# Patient Record
Sex: Female | Born: 1935 | Race: Black or African American | Hispanic: No | Smoking: Never smoker
Health system: Southern US, Community
[De-identification: ages and names within clinical notes are randomized; demographics above are authoritative.]

## PROBLEM LIST (undated history)

## (undated) DIAGNOSIS — G9340 Encephalopathy, unspecified: Secondary | ICD-10-CM

## (undated) DIAGNOSIS — M1990 Unspecified osteoarthritis, unspecified site: Secondary | ICD-10-CM

## (undated) DIAGNOSIS — I4510 Unspecified right bundle-branch block: Secondary | ICD-10-CM

## (undated) DIAGNOSIS — I10 Essential (primary) hypertension: Secondary | ICD-10-CM

## (undated) DIAGNOSIS — E46 Unspecified protein-calorie malnutrition: Secondary | ICD-10-CM

## (undated) DIAGNOSIS — G459 Transient cerebral ischemic attack, unspecified: Secondary | ICD-10-CM

## (undated) DIAGNOSIS — I639 Cerebral infarction, unspecified: Secondary | ICD-10-CM

## (undated) HISTORY — PX: ABDOMINAL HYSTERECTOMY: SHX81

---

## 1998-03-04 ENCOUNTER — Ambulatory Visit (HOSPITAL_COMMUNITY): Admission: RE | Admit: 1998-03-04 | Discharge: 1998-03-04 | Payer: Self-pay | Admitting: Family Medicine

## 2005-04-18 ENCOUNTER — Emergency Department (HOSPITAL_COMMUNITY): Admission: EM | Admit: 2005-04-18 | Discharge: 2005-04-18 | Payer: Self-pay | Admitting: Emergency Medicine

## 2006-07-20 ENCOUNTER — Encounter (HOSPITAL_COMMUNITY): Admission: RE | Admit: 2006-07-20 | Discharge: 2006-07-20 | Payer: Self-pay | Admitting: Cardiology

## 2009-01-26 ENCOUNTER — Emergency Department (HOSPITAL_COMMUNITY): Admission: EM | Admit: 2009-01-26 | Discharge: 2009-01-26 | Payer: Self-pay | Admitting: Emergency Medicine

## 2009-05-30 ENCOUNTER — Emergency Department (HOSPITAL_BASED_OUTPATIENT_CLINIC_OR_DEPARTMENT_OTHER): Admission: EM | Admit: 2009-05-30 | Discharge: 2009-05-30 | Payer: Self-pay | Admitting: Emergency Medicine

## 2009-05-30 ENCOUNTER — Ambulatory Visit: Payer: Self-pay | Admitting: Diagnostic Radiology

## 2010-09-19 ENCOUNTER — Emergency Department (HOSPITAL_COMMUNITY)
Admission: EM | Admit: 2010-09-19 | Discharge: 2010-09-19 | Payer: Self-pay | Source: Home / Self Care | Admitting: Emergency Medicine

## 2010-10-24 ENCOUNTER — Encounter: Payer: Self-pay | Admitting: Cardiology

## 2010-12-14 LAB — URINALYSIS, ROUTINE W REFLEX MICROSCOPIC
Hgb urine dipstick: NEGATIVE
Nitrite: NEGATIVE
Protein, ur: 30 mg/dL — AB
Urobilinogen, UA: 2 mg/dL — ABNORMAL HIGH (ref 0.0–1.0)

## 2010-12-14 LAB — BASIC METABOLIC PANEL
BUN: 20 mg/dL (ref 6–23)
CO2: 29 mEq/L (ref 19–32)
Calcium: 9.7 mg/dL (ref 8.4–10.5)
Creatinine, Ser: 0.8 mg/dL (ref 0.4–1.2)
GFR calc non Af Amer: >60 mL/min (ref >60)
Glucose, Bld: 97 mg/dL (ref 70–99)

## 2010-12-14 LAB — DIFFERENTIAL
Basophils Absolute: 0 10*3/uL (ref 0.0–0.1)
Basophils Relative: 0 % (ref 0–1)
Eosinophils Absolute: 0.1 10*3/uL (ref 0.0–0.7)
Monocytes Relative: 7 % (ref 3–12)
Neutrophils Relative %: 79 % — ABNORMAL HIGH (ref 43–77)

## 2010-12-14 LAB — CBC
Hemoglobin: 11.6 g/dL — ABNORMAL LOW (ref 12.0–15.0)
MCH: 28.4 pg (ref 26.0–34.0)
MCHC: 32.5 g/dL (ref 30.0–36.0)
Platelets: 170 10*3/uL (ref 150–400)
RDW: 14.2 % (ref 11.5–15.5)

## 2010-12-14 LAB — URINE MICROSCOPIC-ADD ON

## 2010-12-24 ENCOUNTER — Emergency Department (HOSPITAL_COMMUNITY)
Admission: EM | Admit: 2010-12-24 | Discharge: 2010-12-24 | Disposition: A | Discharge status: Home / Self Care | Payer: Medicare Other | Attending: Family Medicine | Admitting: Family Medicine

## 2010-12-24 DIAGNOSIS — R319 Hematuria, unspecified: Secondary | ICD-10-CM | POA: Insufficient documentation

## 2010-12-24 DIAGNOSIS — I10 Essential (primary) hypertension: Secondary | ICD-10-CM | POA: Insufficient documentation

## 2010-12-24 DIAGNOSIS — N390 Urinary tract infection, site not specified: Secondary | ICD-10-CM | POA: Insufficient documentation

## 2010-12-24 DIAGNOSIS — R300 Dysuria: Secondary | ICD-10-CM | POA: Insufficient documentation

## 2010-12-24 DIAGNOSIS — M1990 Unspecified osteoarthritis, unspecified site: Secondary | ICD-10-CM | POA: Insufficient documentation

## 2010-12-24 LAB — URINE MICROSCOPIC-ADD ON

## 2010-12-24 LAB — URINALYSIS, ROUTINE W REFLEX MICROSCOPIC
Nitrite: POSITIVE — AB
Specific Gravity, Urine: 1.022 (ref 1.005–1.030)
pH: 8 (ref 5.0–8.0)

## 2010-12-27 LAB — URINE CULTURE

## 2011-01-13 LAB — URINALYSIS, ROUTINE W REFLEX MICROSCOPIC
Bilirubin Urine: NEGATIVE
Glucose, UA: NEGATIVE mg/dL
Ketones, ur: NEGATIVE mg/dL
Protein, ur: NEGATIVE mg/dL

## 2011-01-13 LAB — CBC
Hemoglobin: 12.1 g/dL (ref 12.0–15.0)
RBC: 4.07 MIL/uL (ref 3.87–5.11)

## 2011-01-13 LAB — URINE CULTURE: Colony Count: 3000

## 2011-01-13 LAB — COMPREHENSIVE METABOLIC PANEL
ALT: 10 U/L (ref 0–35)
Alkaline Phosphatase: 60 U/L (ref 39–117)
CO2: 28 mEq/L (ref 19–32)
GFR calc non Af Amer: >60 mL/min (ref >60)
Glucose, Bld: 92 mg/dL (ref 70–99)
Potassium: 3.3 mEq/L — ABNORMAL LOW (ref 3.5–5.1)
Sodium: 139 mEq/L (ref 135–145)
Total Protein: 6.2 g/dL (ref 6.0–8.3)

## 2011-01-13 LAB — DIFFERENTIAL
Basophils Relative: 1 % (ref 0–1)
Eosinophils Absolute: 0.1 10*3/uL (ref 0.0–0.7)
Monocytes Relative: 12 % (ref 3–12)
Neutrophils Relative %: 49 % (ref 43–77)

## 2011-10-27 ENCOUNTER — Encounter (HOSPITAL_BASED_OUTPATIENT_CLINIC_OR_DEPARTMENT_OTHER): Payer: Self-pay

## 2011-10-27 ENCOUNTER — Emergency Department (INDEPENDENT_AMBULATORY_CARE_PROVIDER_SITE_OTHER): Payer: Medicare Other

## 2011-10-27 ENCOUNTER — Observation Stay (HOSPITAL_BASED_OUTPATIENT_CLINIC_OR_DEPARTMENT_OTHER)
Admission: EM | Admit: 2011-10-27 | Discharge: 2011-10-29 | Disposition: A | Discharge status: Home / Self Care | Payer: Medicare Other | Source: Ambulatory Visit | Attending: Internal Medicine | Admitting: Internal Medicine

## 2011-10-27 DIAGNOSIS — M25559 Pain in unspecified hip: Secondary | ICD-10-CM

## 2011-10-27 DIAGNOSIS — R6251 Failure to thrive (child): Secondary | ICD-10-CM

## 2011-10-27 DIAGNOSIS — M1990 Unspecified osteoarthritis, unspecified site: Secondary | ICD-10-CM

## 2011-10-27 DIAGNOSIS — Z9181 History of falling: Secondary | ICD-10-CM | POA: Insufficient documentation

## 2011-10-27 DIAGNOSIS — R6889 Other general symptoms and signs: Secondary | ICD-10-CM | POA: Insufficient documentation

## 2011-10-27 DIAGNOSIS — D649 Anemia, unspecified: Secondary | ICD-10-CM

## 2011-10-27 DIAGNOSIS — M129 Arthropathy, unspecified: Secondary | ICD-10-CM | POA: Insufficient documentation

## 2011-10-27 DIAGNOSIS — M6281 Muscle weakness (generalized): Secondary | ICD-10-CM | POA: Insufficient documentation

## 2011-10-27 DIAGNOSIS — M25551 Pain in right hip: Secondary | ICD-10-CM

## 2011-10-27 DIAGNOSIS — M25569 Pain in unspecified knee: Secondary | ICD-10-CM | POA: Insufficient documentation

## 2011-10-27 DIAGNOSIS — Z01818 Encounter for other preprocedural examination: Secondary | ICD-10-CM

## 2011-10-27 DIAGNOSIS — R531 Weakness: Secondary | ICD-10-CM

## 2011-10-27 DIAGNOSIS — W19XXXA Unspecified fall, initial encounter: Secondary | ICD-10-CM

## 2011-10-27 DIAGNOSIS — R627 Adult failure to thrive: Principal | ICD-10-CM | POA: Insufficient documentation

## 2011-10-27 HISTORY — DX: Unspecified osteoarthritis, unspecified site: M19.90

## 2011-10-27 LAB — CBC
MCH: 27.4 pg (ref 26.0–34.0)
MCHC: 32.1 g/dL (ref 30.0–36.0)
Platelets: 229 10*3/uL (ref 150–400)
RBC: 3.83 MIL/uL — ABNORMAL LOW (ref 3.87–5.11)
RDW: 13.5 % (ref 11.5–15.5)

## 2011-10-27 LAB — URINE CULTURE: Culture  Setup Time: 201301232255

## 2011-10-27 LAB — URINALYSIS, ROUTINE W REFLEX MICROSCOPIC
Bilirubin Urine: NEGATIVE
Glucose, UA: NEGATIVE mg/dL
Ketones, ur: NEGATIVE mg/dL
Specific Gravity, Urine: 1.019 (ref 1.005–1.030)
pH: 7.5 (ref 5.0–8.0)

## 2011-10-27 LAB — DIFFERENTIAL
Basophils Absolute: 0 10*3/uL (ref 0.0–0.1)
Basophils Relative: 0 % (ref 0–1)
Eosinophils Absolute: 0 10*3/uL (ref 0.0–0.7)
Lymphs Abs: 0.7 10*3/uL (ref 0.7–4.0)
Neutrophils Relative %: 74 % (ref 43–77)

## 2011-10-27 LAB — BASIC METABOLIC PANEL
Calcium: 9.2 mg/dL (ref 8.4–10.5)
GFR calc Af Amer: 85 mL/min — ABNORMAL LOW (ref >90)
GFR calc non Af Amer: 73 mL/min — ABNORMAL LOW (ref >90)
Potassium: 3.7 mEq/L (ref 3.5–5.1)
Sodium: 137 mEq/L (ref 135–145)

## 2011-10-27 MED ORDER — SODIUM CHLORIDE 0.9 % IJ SOLN: 3.0000 mL | INTRAMUSCULAR | Status: DC | PRN

## 2011-10-27 MED ORDER — SODIUM CHLORIDE 0.9 % IJ SOLN
3.0000 mL | Freq: Two times a day (BID) | INTRAMUSCULAR | Status: DC
  Administered 2011-10-28 (×3): 3 mL via INTRAVENOUS

## 2011-10-27 MED ORDER — LORATADINE 10 MG PO TABS
10.0000 mg | ORAL_TABLET | Freq: Every day | ORAL | Status: DC
  Administered 2011-10-29: 10 mg via ORAL
  Filled 2011-10-27 (×2): qty 1

## 2011-10-27 MED ORDER — HYDRALAZINE HCL 20 MG/ML IJ SOLN
5.0000 mg | INTRAMUSCULAR | Status: DC | PRN
  Administered 2011-10-28: 5 mg via INTRAVENOUS
  Filled 2011-10-27: qty 0.25

## 2011-10-27 MED ORDER — LORAZEPAM 2 MG/ML IJ SOLN
0.5000 mg | Freq: Once | INTRAMUSCULAR | Status: AC
  Administered 2011-10-28: 0.5 mg via INTRAVENOUS
  Filled 2011-10-27: qty 1

## 2011-10-27 MED ORDER — HYDROMORPHONE HCL PF 1 MG/ML IJ SOLN: 1.0000 mg | Freq: Once | INTRAMUSCULAR | Status: DC

## 2011-10-27 MED ORDER — ENOXAPARIN SODIUM 40 MG/0.4ML ~~LOC~~ SOLN
40.0000 mg | SUBCUTANEOUS | Status: DC
  Administered 2011-10-27 – 2011-10-28 (×2): 40 mg via SUBCUTANEOUS
  Filled 2011-10-27 (×3): qty 0.4

## 2011-10-27 MED ORDER — SODIUM CHLORIDE 0.9 % IV BOLUS (SEPSIS)
500.0000 mL | INTRAVENOUS | Status: AC
  Administered 2011-10-27: 17:00:00 via INTRAVENOUS

## 2011-10-27 MED ORDER — SODIUM CHLORIDE 0.9 % IV SOLN: 250.0000 mL | INTRAVENOUS | Status: DC | PRN

## 2011-10-27 MED ORDER — ACETAMINOPHEN 500 MG PO TABS
1000.0000 mg | ORAL_TABLET | Freq: Once | ORAL | Status: AC
  Administered 2011-10-27: 1000 mg via ORAL
  Filled 2011-10-27: qty 2

## 2011-10-27 NOTE — H&P (Signed)
PCP: None   Chief Complaint: Larey Seat last week, unable to ambulate.   HPI: Tamara Miller is an 78 y.o. female with history of arthritis, allergies, otherwise healthy, fell last week and her right hip. Since then, she has trouble with ambulation. Family also stated she has not been doing well recently. She has decreased appetite and was just failing to thrive. She is usually lucid during the day, but toward the evening, she gets confused. Family believed that she has been sundowning regularly for quite a while. She presents to [facility] where evaluation included a CT scan of her hip which showed no fracture. She has a normal white count, hemoglobin of 10.5 g per decaliter, negative urinalysis, and a chest x-ray showed no acute processes. Family felt that they are not able to care for her adequately, especially when she's not able to ambulate. They are agreeable to placement, but would prefer that she eventually be able to be discharged home. Patient herself is confused and offered no complaint.  Rewiew of Systems:  Review of systems unreliable.   Past Medical History  Diagnosis Date  . Arthritis     Past Surgical History  Procedure Date  . Abdominal hysterectomy     Medications:  HOME MEDS: Prior to Admission medications   Medication Sig Start Date End Date Taking? Authorizing Provider  acetaminophen (TYLENOL) 500 MG tablet Take 1,000 mg by mouth once.   Yes Historical Provider, MD  cetirizine (ZYRTEC) 10 MG tablet Take 10 mg by mouth daily.   Yes Historical Provider, MD  Diclofenac Potassium (ZIPSOR) 25 MG CAPS Take 25 mg by mouth daily as needed. For arthritis pain   Yes Historical Provider, MD  HYDROcodone-acetaminophen (NORCO) 5-325 MG per tablet Take 1 tablet by mouth every 6 (six) hours as needed. For pain   Yes Historical Provider, MD     Allergies:  No Known Allergies  Social History:   does not have a smoking history on file. She does not have any smokeless  tobacco history on file. Her alcohol and drug histories not on file. she lives alone but her family is nearby.  Family History: No family history on file.   Physical Exam: Filed Vitals:   10/27/11 1657 10/27/11 1723 10/27/11 1944 10/27/11 2116  BP: 213/103 210/88 204/103 208/97  Pulse:   89 99  Temp:    97.1 F (36.2 C)  TempSrc:    Oral  Resp:   20 20  Height:    5\' 2"  (1.575 m)  Weight:    52.572 kg (115 lb 14.4 oz)  SpO2:   100% 99%   Blood pressure 208/97, pulse 99, temperature 97.1 F (36.2 C), temperature source Oral, resp. rate 20, height 5\' 2"  (1.575 m), weight 52.572 kg (115 lb 14.4 oz), SpO2 99.00%.  GEN:  Pleasant  person lying in the stretcher in no acute distress; cooperative with exam PSYCH: She is only alert to self; does not appear anxious does not appear depressed; affect is normal HEENT: Mucous membranes pink and anicteric; PERRLA; EOM intact; no cervical lymphadenopathy nor thyromegaly or carotid bruit; no JVD; Breasts:: Not examined CHEST WALL: No tenderness CHEST: Normal respiration, clear to auscultation bilaterally HEART: Regular rate and rhythm; no murmurs rubs or gallops BACK: No kyphosis or scoliosis; no CVA tenderness ABDOMEN: Obese, soft non-tender; no masses, no organomegaly, normal abdominal bowel sounds; no pannus; no intertriginous candida. Rectal Exam: Not done EXTREMITIES: She has a large right knee effusion, age-appropriate arthropathy of  the hands and knees; no edema; no ulcerations. No calf tenderness. Genitalia: not examined PULSES: 2+ and symmetric SKIN: Normal hydration no rash or ulceration CNS: Cranial nerves 2-12 grossly intact no focal neurologic deficit   Labs & Imaging Results for orders placed during the hospital encounter of 10/27/11 (from the past 48 hour(s))  URINALYSIS, ROUTINE W REFLEX MICROSCOPIC     Status: Normal   Collection Time   10/27/11  4:44 PM      Component Value Range Comment   Color, Urine YELLOW  YELLOW      APPearance CLEAR  CLEAR     Specific Gravity, Urine 1.019  1.005 - 1.030     pH 7.5  5.0 - 8.0     Glucose, UA NEGATIVE  NEGATIVE (mg/dL)    Hgb urine dipstick NEGATIVE  NEGATIVE     Bilirubin Urine NEGATIVE  NEGATIVE     Ketones, ur NEGATIVE  NEGATIVE (mg/dL)    Protein, ur NEGATIVE  NEGATIVE (mg/dL)    Urobilinogen, UA 1.0  0.0 - 1.0 (mg/dL)    Nitrite NEGATIVE  NEGATIVE     Leukocytes, UA NEGATIVE  NEGATIVE  MICROSCOPIC NOT DONE ON URINES WITH NEGATIVE PROTEIN, BLOOD, LEUKOCYTES, NITRITE, OR GLUCOSE <1000 mg/dL.  CBC     Status: Abnormal   Collection Time   10/27/11  4:47 PM      Component Value Range Comment   WBC 5.6  4.0 - 10.5 (K/uL)    RBC 3.83 (*) 3.87 - 5.11 (MIL/uL)    Hemoglobin 10.5 (*) 12.0 - 15.0 (g/dL)    HCT 29.5 (*) 62.1 - 46.0 (%)    MCV 85.4  78.0 - 100.0 (fL)    MCH 27.4  26.0 - 34.0 (pg)    MCHC 32.1  30.0 - 36.0 (g/dL)    RDW 30.8  65.7 - 84.6 (%)    Platelets 229  150 - 400 (K/uL)   DIFFERENTIAL     Status: Normal   Collection Time   10/27/11  4:47 PM      Component Value Range Comment   Neutrophils Relative 74  43 - 77 (%)    Neutro Abs 4.1  1.7 - 7.7 (K/uL)    Lymphocytes Relative 13  12 - 46 (%)    Lymphs Abs 0.7  0.7 - 4.0 (K/uL)    Monocytes Relative 12  3 - 12 (%)    Monocytes Absolute 0.7  0.1 - 1.0 (K/uL)    Eosinophils Relative 1  0 - 5 (%)    Eosinophils Absolute 0.0  0.0 - 0.7 (K/uL)    Basophils Relative 0  0 - 1 (%)    Basophils Absolute 0.0  0.0 - 0.1 (K/uL)   BASIC METABOLIC PANEL     Status: Abnormal   Collection Time   10/27/11  4:47 PM      Component Value Range Comment   Sodium 137  135 - 145 (mEq/L)    Potassium 3.7  3.5 - 5.1 (mEq/L)    Chloride 99  96 - 112 (mEq/L)    CO2 29  19 - 32 (mEq/L)    Glucose, Bld 104 (*) 70 - 99 (mg/dL)    BUN 15  6 - 23 (mg/dL)    Creatinine, Ser 9.62  0.50 - 1.10 (mg/dL)    Calcium 9.2  8.4 - 10.5 (mg/dL)    GFR calc non Af Amer 73 (*) >90 (mL/min)    GFR calc Af Amer 85 (*) >90 (  mL/min)     Dg Chest 1 View  10/27/2011  *RADIOLOGY REPORT*  Clinical Data: Preoperative films.  Hip fracture.  CHEST - 1 VIEW  Comparison: PA and lateral chest 09/19/2010.  Findings: Heart size is upper normal with some vascular congestion. No consolidative process, pneumothorax or effusion.  No focal bony abnormality.  IMPRESSION: No acute finding.  Original Report Authenticated By: Bernadene Bell. Maricela Curet, M.D.   Ct Hip Right Wo Contrast  10/27/2011  *RADIOLOGY REPORT*  Clinical Data: Status post fall 1 week ago.  Right hip pain.  CT OF THE RIGHT HIP WITHOUT CONTRAST  Technique:  Multidetector CT imaging was performed according to the standard protocol. Multiplanar CT image reconstructions were also generated.  Comparison: Plain films the right hip 05/30/2009 and CT abdomen and pelvis 01/26/2009.  Findings: Both femoral heads are located.  There is advanced bilateral hip degenerative change.  No fracture is identified. Loose bodies are seen in the right hip joint, unchanged.  No joint effusion is identified. Imaged intra-pelvic contents show no focal abnormality.  IMPRESSION:  1.  Negative for fracture. 2.  Advanced bilateral hip degenerative disease.  Original Report Authenticated By: Bernadene Bell. Maricela Curet, M.D.      Assessment Failure to thrive Anemia Right knee swelling Sundowning Allergies  PLAN: Will admit her to the hospital since family cannot adequately care for her at this point. Please consult orthopedics about her right knee swelling and pain. As far as her medication is concerned, she has been on practically nothing except allergy pills. Family is hopeful that she be able to be discharged home if she has increased strength and able to ambulate.  But if she cannot, they are agreeable to having her placed. She is otherwise stable, full code, and will be admitted to triad hospitalist service.  Other plans as per orders.    Bless Lisenby 10/27/2011, 10:09 PM

## 2011-10-27 NOTE — ED Notes (Addendum)
Pt was found after a fall on 1/14 and 1/15-was seen at [facility] 1/15-daughter states pt has cont'd to c/o pain to right hip, leg, foot and will not bear weight and that pt developed fever last night-pt normally uses a cane for ambulation

## 2011-10-27 NOTE — ED Provider Notes (Signed)
History     CSN: [id number]  Arrival date & time 10/27/11  1550   First MD Initiated Contact with Patient 10/27/11 1610      Chief Complaint  Patient presents with  . Fall   patient was found to after a fall on January 14. She was seen at [facility] on the 15th. Apparently continues to have pain in the right hip and right leg. Normally uses a cane for assistance with her ambulation. Family states she was actually seen by her primary Dr., Dr. Doristine Counter at [facility] in [place] on the 16th an IV was attempted, but unsuccessful. The family states over the past, week. She's been unable to bear weight and he states that they have been caring or around the home. She lives alone, but her daughter lives next to her. The family. Also, states she has had "strong urine, and, "she's had no fevers, no abdominal pain. No dizziness or syncope. She does have chronic arthritis and has diffuse joint pain. Elevated blood pressure noted in triage, however, family states she does not have any history of high blood pressure. She does have an orthopedist, [facility]. Patient denies any injury to the head or neck or back.  (Consider location/radiation/quality/duration/timing/severity/associated sxs/prior treatment) HPI  Past Medical History  Diagnosis Date  . Arthritis     Past Surgical History  Procedure Date  . Abdominal hysterectomy     No family history on file.  History  Substance Use Topics  . Smoking status: Not on file  . Smokeless tobacco: Not on file  . Alcohol Use:     OB History    Grav Para Term Preterm Abortions TAB SAB Ect Mult Living                  Review of Systems  All other systems reviewed and are negative.    Allergies  Review of patient's allergies indicates no known allergies.  Home Medications   Current Outpatient Rx  Name Route Sig Dispense Refill  . ACETAMINOPHEN 500 MG PO TABS Oral Take 1,000 mg by mouth once.    Marland Kitchen CETIRIZINE  HCL 10 MG PO TABS Oral Take 10 mg by mouth daily.    Marland Kitchen DICLOFENAC POTASSIUM 25 MG PO CAPS Oral Take 25 mg by mouth daily as needed. For arthritis pain    . HYDROCODONE-ACETAMINOPHEN 5-325 MG PO TABS Oral Take 1 tablet by mouth every 6 (six) hours as needed. For pain      BP 210/88  Pulse 87  Temp(Src) 97.9 F (36.6 C) (Oral)  Resp 18  SpO2 100%  Physical Exam  Nursing note and vitals reviewed. Constitutional: She is oriented to person, place, and time. She appears well-developed and well-nourished. No distress.  HENT:  Head: Normocephalic and atraumatic.  Eyes: Conjunctivae and EOM are normal. Pupils are equal, round, and reactive to light.  Neck: Neck supple.  Cardiovascular: Normal rate and regular rhythm.  Exam reveals no gallop and no friction rub.   No murmur heard. Pulmonary/Chest: Breath sounds normal. She has no wheezes. She has no rales. She exhibits no tenderness.  Abdominal: Soft. Bowel sounds are normal. She exhibits no distension. There is no tenderness. There is no rebound and no guarding.  Musculoskeletal: Normal range of motion.       Diffuse bilateral arthritic changes to the shoulders, elbows, knees. Diffuse tenderness to palpation of the right hip with limited range of motion secondary to pain. No erythema. No calf tenderness. No  crepitus.  Neurological: She is alert and oriented to person, place, and time. No cranial nerve deficit. Coordination normal.  Skin: Skin is warm and dry. No rash noted. No erythema. No pallor.  Psychiatric: She has a normal mood and affect.    ED Course  Procedures (including critical care time)  Labs Reviewed  CBC - Abnormal; Notable for the following:    RBC 3.83 (*)    Hemoglobin 10.5 (*)    HCT 32.7 (*)    All other components within normal limits  BASIC METABOLIC PANEL - Abnormal; Notable for the following:    Glucose, Bld 104 (*)    GFR calc non Af Amer 73 (*)    GFR calc Af Amer 85 (*)    All other components within  normal limits  DIFFERENTIAL  URINALYSIS, ROUTINE W REFLEX MICROSCOPIC  URINE CULTURE   Dg Chest 1 View  10/27/2011  *RADIOLOGY REPORT*  Clinical Data: Preoperative films.  Hip fracture.  CHEST - 1 VIEW  Comparison: PA and lateral chest 09/19/2010.  Findings: Heart size is upper normal with some vascular congestion. No consolidative process, pneumothorax or effusion.  No focal bony abnormality.  IMPRESSION: No acute finding.  Original Report Authenticated By: Bernadene Bell. Maricela Curet, M.D.   Ct Hip Right Wo Contrast  10/27/2011  *RADIOLOGY REPORT*  Clinical Data: Status post fall 1 week ago.  Right hip pain.  CT OF THE RIGHT HIP WITHOUT CONTRAST  Technique:  Multidetector CT imaging was performed according to the standard protocol. Multiplanar CT image reconstructions were also generated.  Comparison: Plain films the right hip 05/30/2009 and CT abdomen and pelvis 01/26/2009.  Findings: Both femoral heads are located.  There is advanced bilateral hip degenerative change.  No fracture is identified. Loose bodies are seen in the right hip joint, unchanged.  No joint effusion is identified. Imaged intra-pelvic contents show no focal abnormality.  IMPRESSION:  1.  Negative for fracture. 2.  Advanced bilateral hip degenerative disease.  Original Report Authenticated By: Bernadene Bell. Maricela Curet, M.D.     No diagnosis found.    MDM  Patient is seen and examined, initial history and physical is completed. Evaluation initiated    Will obtain CT scan of the hip to rule out occult fracture. IV fluids. Basic laboratory studies, urine, and urine culture. Also, requesting recheck blood pressure.   6:06 PM  Results for orders placed during the hospital encounter of 10/27/11  CBC      Component Value Range   WBC 5.6  4.0 - 10.5 (K/uL)   RBC 3.83 (*) 3.87 - 5.11 (MIL/uL)   Hemoglobin 10.5 (*) 12.0 - 15.0 (g/dL)   HCT 91.4 (*) 78.2 - 46.0 (%)   MCV 85.4  78.0 - 100.0 (fL)   MCH 27.4  26.0 - 34.0 (pg)   MCHC  32.1  30.0 - 36.0 (g/dL)   RDW 95.6  21.3 - 08.6 (%)   Platelets 229  150 - 400 (K/uL)  DIFFERENTIAL      Component Value Range   Neutrophils Relative 74  43 - 77 (%)   Neutro Abs 4.1  1.7 - 7.7 (K/uL)   Lymphocytes Relative 13  12 - 46 (%)   Lymphs Abs 0.7  0.7 - 4.0 (K/uL)   Monocytes Relative 12  3 - 12 (%)   Monocytes Absolute 0.7  0.1 - 1.0 (K/uL)   Eosinophils Relative 1  0 - 5 (%)   Eosinophils Absolute 0.0  0.0 - 0.7 (K/uL)  Basophils Relative 0  0 - 1 (%)   Basophils Absolute 0.0  0.0 - 0.1 (K/uL)  BASIC METABOLIC PANEL      Component Value Range   Sodium 137  135 - 145 (mEq/L)   Potassium 3.7  3.5 - 5.1 (mEq/L)   Chloride 99  96 - 112 (mEq/L)   CO2 29  19 - 32 (mEq/L)   Glucose, Bld 104 (*) 70 - 99 (mg/dL)   BUN 15  6 - 23 (mg/dL)   Creatinine, Ser 1.61  0.50 - 1.10 (mg/dL)   Calcium 9.2  8.4 - 09.6 (mg/dL)   GFR calc non Af Amer 73 (*) >90 (mL/min)   GFR calc Af Amer 85 (*) >90 (mL/min)  URINALYSIS, ROUTINE W REFLEX MICROSCOPIC      Component Value Range   Color, Urine YELLOW  YELLOW    APPearance CLEAR  CLEAR    Specific Gravity, Urine 1.019  1.005 - 1.030    pH 7.5  5.0 - 8.0    Glucose, UA NEGATIVE  NEGATIVE (mg/dL)   Hgb urine dipstick NEGATIVE  NEGATIVE    Bilirubin Urine NEGATIVE  NEGATIVE    Ketones, ur NEGATIVE  NEGATIVE (mg/dL)   Protein, ur NEGATIVE  NEGATIVE (mg/dL)   Urobilinogen, UA 1.0  0.0 - 1.0 (mg/dL)   Nitrite NEGATIVE  NEGATIVE    Leukocytes, UA NEGATIVE  NEGATIVE    No results found.  6:06 PM  All lab results, xrays and CT discussed with patient and family.  CT Negative for fracture.  Advanced Degenerative Disease.     Long discussion with family in regard to admission versus outpatient treatment. The family states that the patient essentially cannot longer ambulate at home and will likely need placement for further care and evaluation. Admitted to triad for further care in stable condition  Fruma Africa A. Patrica Duel, MD 10/27/11 2321

## 2011-10-27 NOTE — ED Notes (Signed)
[facility] here for transport.  

## 2011-10-28 ENCOUNTER — Inpatient Hospital Stay (HOSPITAL_COMMUNITY): Payer: Medicare Other

## 2011-10-28 ENCOUNTER — Encounter (HOSPITAL_COMMUNITY): Payer: Self-pay | Admitting: General Practice

## 2011-10-28 LAB — CBC
HCT: 32.7 % — ABNORMAL LOW (ref 36.0–46.0)
Hemoglobin: 10.6 g/dL — ABNORMAL LOW (ref 12.0–15.0)
MCH: 27.7 pg (ref 26.0–34.0)
MCHC: 32.4 g/dL (ref 30.0–36.0)
MCV: 85.6 fL (ref 78.0–100.0)

## 2011-10-28 LAB — BASIC METABOLIC PANEL
BUN: 12 mg/dL (ref 6–23)
CO2: 24 mEq/L (ref 19–32)
GFR calc non Af Amer: 76 mL/min — ABNORMAL LOW (ref >90)
Glucose, Bld: 90 mg/dL (ref 70–99)
Potassium: 3.5 mEq/L (ref 3.5–5.1)

## 2011-10-28 MED ORDER — ACETAMINOPHEN 500 MG PO TABS
500.0000 mg | ORAL_TABLET | Freq: Three times a day (TID) | ORAL | Status: DC
  Administered 2011-10-28 – 2011-10-29 (×3): 500 mg via ORAL
  Filled 2011-10-28 (×5): qty 1

## 2011-10-28 NOTE — Progress Notes (Signed)
Utilization Review Completed.Tamara Miller T12/01/2012   

## 2011-10-28 NOTE — Progress Notes (Signed)
Subjective: Woke up after a prolonged sleep and is without complains. Required extensive help to move from bed to chair - 1 person assist. Could not bear weight on the right LE   Physical Exam: Blood pressure 191/103, pulse 82, temperature 97.2 F (36.2 C), temperature source Axillary, resp. rate 20, height 5\' 2"  (1.575 m), weight 52.1 kg (114 lb 13.8 oz), SpO2 97.00%. Alert and oriented to self only CVS: RRR RS: CTAB Abdomen; soft, NT Right knee with effusion    Investigations:  No results found for this or any previous visit (from the past 240 hour(s)).   Basic Metabolic Panel:  Basename 10/27/11 1647  NA 137  K 3.7  CL 99  CO2 29  GLUCOSE 104*  BUN 15  CREATININE 0.60  CALCIUM 9.2  MG --  PHOS --   Liver Function Tests: No results found for this basename: AST:2,ALT:2,ALKPHOS:2,BILITOT:2,PROT:2,ALBUMIN:2 in the last 72 hours   CBC:  Basename 10/28/11 0630 10/27/11 1647  WBC 4.7 5.6  NEUTROABS -- 4.1  HGB 10.6* 10.5*  HCT 32.7* 32.7*  MCV 85.6 85.4  PLT 240 229    Dg Chest 1 View  10/27/2011  *RADIOLOGY REPORT*  Clinical Data: Preoperative films.  Hip fracture.  CHEST - 1 VIEW  Comparison: PA and lateral chest 09/19/2010.  Findings: Heart size is upper normal with some vascular congestion. No consolidative process, pneumothorax or effusion.  No focal bony abnormality.  IMPRESSION: No acute finding.  Original Report Authenticated By: Bernadene Bell. Maricela Curet, M.D.   Ct Hip Right Wo Contrast  10/27/2011  *RADIOLOGY REPORT*  Clinical Data: Status post fall 1 week ago.  Right hip pain.  CT OF THE RIGHT HIP WITHOUT CONTRAST  Technique:  Multidetector CT imaging was performed according to the standard protocol. Multiplanar CT image reconstructions were also generated.  Comparison: Plain films the right hip 05/30/2009 and CT abdomen and pelvis 01/26/2009.  Findings: Both femoral heads are located.  There is advanced bilateral hip degenerative change.  No fracture is  identified. Loose bodies are seen in the right hip joint, unchanged.  No joint effusion is identified. Imaged intra-pelvic contents show no focal abnormality.  IMPRESSION:  1.  Negative for fracture. 2.  Advanced bilateral hip degenerative disease.  Original Report Authenticated By: Bernadene Bell. Maricela Curet, M.D.      Medications:  Scheduled:   . acetaminophen  1,000 mg Oral Once  . enoxaparin  40 mg Subcutaneous Q24H  . loratadine  10 mg Oral Daily  . LORazepam  0.5 mg Intravenous Once  . sodium chloride  500 mL Intravenous STAT  . sodium chloride  3 mL Intravenous Q12H  . DISCONTD:  HYDROmorphone (DILAUDID) injection  1 mg Intravenous Once   Continuous:  AVW:[id number] chloride, hydrALAZINE, sodium chloride  Impression:  Active Problems:  Failure to thrive  Arthritis  Anemia  Generalized weakness  Pain in right hip R knee arthritis    Plan: Ortho Consult Dr. Lequita Halt Dc home with [facility] and family support      LOS: 1 day   Zayli Villafuerte, MD Pager: [phone] 10/28/2011, 7:35 AM

## 2011-10-28 NOTE — Progress Notes (Signed)
Physical Therapy Evaluation Patient Details Name: Tamara Miller MRN: [id number] DOB: 09/08/1911 Today's Date: 10/28/2011  Problem List:  Patient Active Problem List  Diagnoses  . Failure to thrive  . Arthritis  . Anemia  . Generalized weakness  . Pain in right hip    Past Medical History:  Past Medical History  Diagnosis Date  . Arthritis    Past Surgical History:  Past Surgical History  Procedure Date  . Abdominal hysterectomy     PT Assessment/Plan/Recommendation PT Assessment Clinical Impression Statement: Patient currently presents with significant strength deficits, decreased LE power and eccentric control, acute right knee pain with swelling, right knee buckling with delayed balance reactions and reaction time resulting in significant increased risk of fall. Acute PT to follow patient at frequency of 3 times per week during acute hospital stay. Per MD, family would like patient to return home. If patient to d/c home, recommend 24/7 physical assistance with all mobility and HHPT. Also recommend RW for mobility instead of SPC for increased safety and stability. Patient may, however, benefit from short term SNF stay for further inpatient PT to increase functional gains and decrease caregiver burden prior to d/c home. PT Recommendation/Assessment: Patient will need skilled PT in the acute care venue PT Problem List: Decreased strength;Decreased activity tolerance;Decreased balance;Decreased mobility;Decreased cognition;Decreased knowledge of use of DME;Decreased safety awareness;Pain Barriers to Discharge: Decreased caregiver support PT Therapy Diagnosis : Difficulty walking;Generalized weakness;Acute pain PT Plan PT Frequency: Min 3X/week PT Treatment/Interventions: DME instruction;Gait training;Functional mobility training;Balance training;Therapeutic exercise;Therapeutic activities;Patient/family education;Wheelchair mobility training PT Recommendation Follow Up  Recommendations: Home health PT;Skilled nursing facility Equipment Recommended: Rolling walker with 5" wheels;Wheelchair (measurements);Wheelchair cushion (measurements);Other (comment) (family may want W/C for ease. 16x16, jay basic cushion&back) PT Goals  Acute Rehab PT Goals PT Goal Formulation: Patient unable to participate in goal setting Time For Goal Achievement: 7 days Pt will go Supine/Side to Sit: with supervision PT Goal: Supine/Side to Sit - Progress: Goal set today Pt will go Sit to Supine/Side: with supervision PT Goal: Sit to Supine/Side - Progress: Goal set today Pt will go Sit to Stand: with min assist;with upper extremity assist PT Goal: Sit to Stand - Progress: Goal set today Pt will go Stand to Sit: with min assist;with upper extremity assist PT Goal: Stand to Sit - Progress: Goal set today Pt will Transfer Bed to Chair/Chair to Bed: with min assist PT Transfer Goal: Bed to Chair/Chair to Bed - Progress: Goal set today Pt will Ambulate: 16 - 50 feet;with least restrictive assistive device;with min assist PT Goal: Ambulate - Progress: Goal set today  PT Evaluation Precautions/Restrictions  Precautions Precautions: Fall Required Braces or Orthoses: No Restrictions Weight Bearing Restrictions: No (none in order management section in chart at this time) Prior Functioning  Home Living Lives With: Alone;Other (Comment) (per chart, patient is poor historian. Will f/u with family.) Receives Help From: Family;Other (Comment) (per chart, dtr lives next door) Type of Home: Other (Comment) (unable to determine, patient is poor historian) Home Layout: Other (Comment) (unable to determine, patient is poor historian) Home Access: Other (comment) (unable to determine, patient is poor historian) Bathroom Shower/Tub: Other (comment) (unable to determine, patient is poor historian) Firefighter:  (unable to determine, patient is poor historian) Bathroom Accessibility:  (unable  to determine, patient is poor historian) Home Adaptive Equipment: Straight cane;Other (comment) (per chart, patient used SPC PTA) Additional Comments: No family present at bedside this afternoon. Per MD, patient has two strong nephews that are  able to physically assist her, however ? if family can provide 24/7 assist. Will continue to follow up as family is available. Prior Function Level of Independence: Other (comment) (unable to determine, patient is poor historian) Driving:  (unable to determine, patient is poor historian) Vocation: Other (comment) (unable to determine, patient is poor historian) Comments: No family present at bedside this afternoon. Per MD, patient has two strong nephews that are able to physically assist her, however ? if family can provide 24/7 assist. Will continue to follow up as family is available. Cognition Cognition Arousal/Alertness: Awake/alert Overall Cognitive Status: History of cognitive impairments History of Cognitive Impairment:  (Unable to determine, no family at bedside) Orientation Level: Oriented to person;Disoriented to place;Disoriented to time;Disoriented to situation Cognition - Other Comments: Will follow up with family when family available Sensation/Coordination Sensation Light Touch: Appears Intact Stereognosis: Not tested Hot/Cold: Not tested Proprioception: Appears Intact Coordination Gross Motor Movements are Fluid and Coordinated: Yes Fine Motor Movements are Fluid and Coordinated: Yes Heel Shin Test: WNL Extremity Assessment RLE Assessment RLE Assessment: Exceptions to [facility] (edema right LE, strength grossly 2/5 throughout. PROM WNL.) LLE Assessment LLE Assessment: Exceptions to WFL (AAROM WNL. Strength grossly 3/5. Decreased sustained contractions/muscular endurance) Mobility (including Balance) Bed Mobility Bed Mobility: Yes Rolling Left: 3: Mod assist;With rail Left Sidelying to Sit: 3: Mod assist;With rails;HOB elevated  (comment degrees) (45 degrees) Sitting - Scoot to Edge of Bed: 3: Mod assist;With rail Transfers Transfers: Yes Sit to Stand: 2: Max assist;From bed;With upper extremity assist Sit to Stand Details (indicate cue type and reason): decreased bilat LE power and decreased anterior weight shift Stand to Sit: 2: Max assist;With upper extremity assist;To chair/3-in-1;With armrests Stand to Sit Details: decreased bilat LE eccentric control resulting in uncontrolled descent Ambulation/Gait Ambulation/Gait: Yes Ambulation/Gait Assistance: 3: Mod assist Ambulation/Gait Assistance Details (indicate cue type and reason): right knee buckles resultingi in increased physical assist. Balance reactions and reaction time to LOB delayed resulting in high risk of fall. Ambulation Distance (Feet): 25 Feet Assistive device: Rolling walker Gait Pattern: Step-to pattern;Decreased step length - left;Decreased stance time - right;Shuffle;Trunk flexed Gait velocity: significantly decreased Stairs: No Wheelchair Mobility Wheelchair Mobility: No  Posture/Postural Control Posture/Postural Control: Postural limitations Postural Limitations: significant thoracic kyphosis, not flexible, decreased dissociation and trunk and pelvic movements Balance Balance Assessed: Yes Static Sitting Balance Static Sitting - Balance Support: Feet supported;No upper extremity supported Static Sitting - Level of Assistance: 5: Stand by assistance Dynamic Sitting Balance Dynamic Sitting - Balance Support: Right upper extremity supported;During functional activity;Feet supported Dynamic Sitting - Level of Assistance: 4: Min Oncologist Standing - Balance Support: Bilateral upper extremity supported Static Standing - Level of Assistance: 4: Min assist Dynamic Standing Balance Dynamic Standing - Balance Support: Bilateral upper extremity supported Dynamic Standing - Level of Assistance: 3: Mod assist;Other  (comment) (right knee buckles)   End of Session PT - End of Session Equipment Utilized During Treatment: Gait belt;Other (comment) (RW) Activity Tolerance: Patient limited by fatigue Patient left: in chair;with call bell in reach;Other (comment) (with sitter present) Nurse Communication: Mobility status for transfers;Mobility status for ambulation General Behavior During Session: [facility] for tasks performed Cognition: Impaired, at baseline  Romeo Rabon 10/28/2011, 5:14 PM

## 2011-10-29 MED ORDER — ACETAMINOPHEN 500 MG PO TABS: 500.0000 mg | ORAL_TABLET | Freq: Three times a day (TID) | ORAL | Status: AC

## 2011-10-29 NOTE — Progress Notes (Signed)
   CARE MANAGEMENT NOTE 10/29/2011  Patient:  Tamara Miller, Tamara Miller   Account Number:  [id number]  Date Initiated:  10/28/2011  Documentation initiated by:  Junius Creamer  Subjective/Objective Assessment:     lives alone   Anticipated DC Date:  10/29/2011   Anticipated DC Plan:  HOME W HOME HEALTH SERVICES      DC Planning Services  CM consult      [facility] Choice  Resumption Of Svcs/PTA Provider   Choice offered to / List presented to:     DME arranged  HOSPITAL BED  Levan Hurst      DME agency  [facility]     [facility] arranged  HH-1 RN  HH-2 PT      [facility] agency  [facility]   Status of service:  Completed, signed off  Discharge Disposition:  HOME W HOME HEALTH SERVICES  Comments:  PCP:  Dr. Rosezetta Schlatter  Contact:  Germain Osgood, daughter  [phone]  10/29/11 1040 Saliha Salts RN MSN CCM Per daughter, [facility] was starting services prior to hospital admission and will resume upon discharge. PT recommends rolling walker and w/c for home use. Daughter states pt already has a w/c, will need the rolling walker and a hospital bed - equipment ordered.  Pt to be transported home via ambulance.

## 2011-10-29 NOTE — Progress Notes (Signed)
Clinical Child psychotherapist (CSW) informed that pt in need of a non emergency ambulance home. CSW confirmed pt address and contacted [facility] for discharge. CSW signing off.  Theresia Bough, MSW, Theresia Majors [phone]

## 2011-10-29 NOTE — Progress Notes (Signed)
Utilization Review Completed.Shaina Gullatt T1/25/2013   

## 2011-10-29 NOTE — Progress Notes (Signed)
OT NOTE:  Brief contact made with patient who is to be discharged today.  Pt with baseline dementia admitted for R hip pain, negative for fx.  Reportedly pt was being assisted with all aspects of ADL and IADL prior to admission.  Pt is at her baseline and will have 24 hour assist of her family upon d/c.  No OT needs.  Did not proceed with full eval.  Signing off. 10/29/2011 Martie Round, OTR/L Pager: [phone]

## 2011-10-29 NOTE — Progress Notes (Signed)
Subjective:  Patient alert in bed denies any right leg pain and keeps saying that we are very nice    Physical Exam: Blood pressure 138/69, pulse 77, temperature 97.4 F (36.3 C), temperature source Axillary, resp. rate 20, height 5\' 2"  (1.575 m), weight 54.2 kg (119 lb 7.8 oz), SpO2 96.00%.  Temp:  [96.6 F (35.9 C)-97.5 F (36.4 C)] 97.4 F (36.3 C) (01/25 0551) Pulse Rate:  [77-96] 77  (01/25 0551) Resp:  [20-22] 20  (01/25 0551) BP: (109-139)/(61-75) 138/69 mmHg (01/25 0551) SpO2:  [96 %-99 %] 96 % (01/25 0551) Weight:  [54.2 kg (119 lb 7.8 oz)] 54.2 kg (119 lb 7.8 oz) (01/25 0551) Alert  Cvs: rrr Rs: ctab Abdomen: soft, NT Right knee with decrease tenderness  Investigations:  Recent Results (from the past 240 hour(s))  URINE CULTURE     Status: Normal   Collection Time   10/27/11  4:44 PM      Component Value Range Status Comment   Specimen Description URINE, CATHETERIZED   Final    Special Requests NONE   Final    Setup Time [id number]   Final    Colony Count NO GROWTH   Final    Culture NO GROWTH   Final    Report Status 10/28/2011 FINAL   Final      Basic Metabolic Panel:  Basename 10/28/11 0630 10/27/11 1647  NA 139 137  K 3.5 3.7  CL 103 99  CO2 24 29  GLUCOSE 90 104*  BUN 12 15  CREATININE 0.53 0.60  CALCIUM 9.0 9.2  MG -- --  PHOS -- --   Liver Function Tests: No results found for this basename: AST:2,ALT:2,ALKPHOS:2,BILITOT:2,PROT:2,ALBUMIN:2 in the last 72 hours   CBC:  Basename 10/28/11 0630 10/27/11 1647  WBC 4.7 5.6  NEUTROABS -- 4.1  HGB 10.6* 10.5*  HCT 32.7* 32.7*  MCV 85.6 85.4  PLT 240 229    Dg Chest 1 View  10/27/2011  *RADIOLOGY REPORT*  Clinical Data: Preoperative films.  Hip fracture.  CHEST - 1 VIEW  Comparison: PA and lateral chest 09/19/2010.  Findings: Heart size is upper normal with some vascular congestion. No consolidative process, pneumothorax or effusion.  No focal bony abnormality.  IMPRESSION: No acute  finding.  Original Report Authenticated By: Bernadene Bell. Maricela Curet, M.D.   Ct Hip Right Wo Contrast  10/27/2011  *RADIOLOGY REPORT*  Clinical Data: Status post fall 1 week ago.  Right hip pain.  CT OF THE RIGHT HIP WITHOUT CONTRAST  Technique:  Multidetector CT imaging was performed according to the standard protocol. Multiplanar CT image reconstructions were also generated.  Comparison: Plain films the right hip 05/30/2009 and CT abdomen and pelvis 01/26/2009.  Findings: Both femoral heads are located.  There is advanced bilateral hip degenerative change.  No fracture is identified. Loose bodies are seen in the right hip joint, unchanged.  No joint effusion is identified. Imaged intra-pelvic contents show no focal abnormality.  IMPRESSION:  1.  Negative for fracture. 2.  Advanced bilateral hip degenerative disease.  Original Report Authenticated By: Bernadene Bell. Maricela Curet, M.D.   Dg Knee Right Port  10/28/2011  *RADIOLOGY REPORT*  Clinical Data: Knee effusion.  Posterior knee pain.  PORTABLE RIGHT KNEE - 1-2 VIEW  Comparison: None.  Findings: Severe osteoarthritic changes are present in the right knee.  There is near total loss of joint space both medially and laterally.  Advanced patellofemoral changes are evident.  A moderate sized joint effusion is present.  No acute osseous abnormality is present.  Atherosclerotic calcifications are noted.  IMPRESSION:  1.  Advanced degenerative changes within the right knee. 2.  Moderate sized joint effusion without evidence for acute osseous abnormality. 3.  Atherosclerosis.  Original Report Authenticated By: Jamesetta Orleans. MATTERN, M.D.      Medications:  Scheduled:    . acetaminophen  500 mg Oral TID  . enoxaparin  40 mg Subcutaneous Q24H  . loratadine  10 mg Oral Daily  . sodium chloride  3 mL Intravenous Q12H   Continuous:  FAO:ZHYQMV chloride, hydrALAZINE, sodium chloride  Impression:  Active Problems:  Failure to thrive  Arthritis  Anemia   Generalized weakness  Pain in right hip R knee arthritis    Plan:  Home with home health     LOS: 2 days   Claudia Alvizo, MD Pager: [phone] 10/29/2011, 1:34 PM

## 2011-10-29 NOTE — Discharge Summary (Signed)
Patient ID: Tamara Miller MRN: [id number] DOB/AGE: 76/03/1911 76 y.o. Primary Care Physician:BURNETT,BRENT A, MD, MD Admit date: 10/27/2011 Discharge date: 10/29/2011    Discharge Diagnoses:   Active Problems:  Failure to thrive  Arthritis  Anemia  Generalized weakness  Pain in right hip   Medication List  As of 10/29/2011  1:35 PM   CHANGE how you take these medications         acetaminophen 500 MG tablet   Commonly known as: TYLENOL   Take 1 tablet (500 mg total) by mouth 3 (three) times daily.   What changed: - dose - how often to take the med         CONTINUE taking these medications         cetirizine 10 MG tablet   Commonly known as: ZYRTEC         STOP taking these medications         HYDROcodone-acetaminophen 5-325 MG per tablet      ZIPSOR 25 MG Caps          Where to get your medications    These are the prescriptions that you need to pick up. We sent them to a specific pharmacy, so you will need to go there to get them.   [facility] - [place] - [address] [address] [address]    [address] [place] [address]    Phone: [phone]        acetaminophen 500 MG tablet            Discharged Condition:fair. Family will provide 24hrs support and care   Consults:none  Significant Diagnostic Studies: Dg Chest 1 View  10/27/2011  *RADIOLOGY REPORT*  Clinical Data: Preoperative films.  Hip fracture.  CHEST - 1 VIEW  Comparison: PA and lateral chest 09/19/2010.  Findings: Heart size is upper normal with some vascular congestion. No consolidative process, pneumothorax or effusion.  No focal bony abnormality.  IMPRESSION: No acute finding.  Original Report Authenticated By: Bernadene Bell. Maricela Curet, M.D.   Ct Hip Right Wo Contrast  10/27/2011  *RADIOLOGY REPORT*  Clinical Data: Status post fall 1 week ago.  Right hip pain.  CT OF THE RIGHT HIP WITHOUT CONTRAST  Technique:  Multidetector CT imaging was  performed according to the standard protocol. Multiplanar CT image reconstructions were also generated.  Comparison: Plain films the right hip 05/30/2009 and CT abdomen and pelvis 01/26/2009.  Findings: Both femoral heads are located.  There is advanced bilateral hip degenerative change.  No fracture is identified. Loose bodies are seen in the right hip joint, unchanged.  No joint effusion is identified. Imaged intra-pelvic contents show no focal abnormality.  IMPRESSION:  1.  Negative for fracture. 2.  Advanced bilateral hip degenerative disease.  Original Report Authenticated By: Bernadene Bell. Maricela Curet, M.D.   Dg Knee Right Port  10/28/2011  *RADIOLOGY REPORT*  Clinical Data: Knee effusion.  Posterior knee pain.  PORTABLE RIGHT KNEE - 1-2 VIEW  Comparison: None.  Findings: Severe osteoarthritic changes are present in the right knee.  There is near total loss of joint space both medially and laterally.  Advanced patellofemoral changes are evident.  A moderate sized joint effusion is present.  No acute osseous abnormality is present.  Atherosclerotic calcifications are noted.  IMPRESSION:  1.  Advanced degenerative changes within the right knee. 2.  Moderate sized joint effusion without evidence for acute osseous abnormality. 3.  Atherosclerosis.  Original Report Authenticated By: Jamesetta Orleans. MATTERN, M.D.    Lab Results: Results for orders placed during the hospital encounter of 10/27/11 (from the past 48 hour(s))  URINE CULTURE     Status: Normal   Collection Time   10/27/11  4:44 PM      Component Value Range Comment   Specimen Description URINE, CATHETERIZED      Special Requests NONE      Setup Time [id number]      Colony Count NO GROWTH      Culture NO GROWTH      Report Status 10/28/2011 FINAL     URINALYSIS, ROUTINE W REFLEX MICROSCOPIC     Status: Normal   Collection Time   10/27/11  4:44 PM      Component Value Range Comment   Color, Urine YELLOW  YELLOW     APPearance CLEAR  CLEAR      Specific Gravity, Urine 1.019  1.005 - 1.030     pH 7.5  5.0 - 8.0     Glucose, UA NEGATIVE  NEGATIVE (mg/dL)    Hgb urine dipstick NEGATIVE  NEGATIVE     Bilirubin Urine NEGATIVE  NEGATIVE     Ketones, ur NEGATIVE  NEGATIVE (mg/dL)    Protein, ur NEGATIVE  NEGATIVE (mg/dL)    Urobilinogen, UA 1.0  0.0 - 1.0 (mg/dL)    Nitrite NEGATIVE  NEGATIVE     Leukocytes, UA NEGATIVE  NEGATIVE  MICROSCOPIC NOT DONE ON URINES WITH NEGATIVE PROTEIN, BLOOD, LEUKOCYTES, NITRITE, OR GLUCOSE <1000 mg/dL.  CBC     Status: Abnormal   Collection Time   10/27/11  4:47 PM      Component Value Range Comment   WBC 5.6  4.0 - 10.5 (K/uL)    RBC 3.83 (*) 3.87 - 5.11 (MIL/uL)    Hemoglobin 10.5 (*) 12.0 - 15.0 (g/dL)    HCT 02.7 (*) 25.3 - 46.0 (%)    MCV 85.4  78.0 - 100.0 (fL)    MCH 27.4  26.0 - 34.0 (pg)    MCHC 32.1  30.0 - 36.0 (g/dL)    RDW 66.4  40.3 - 47.4 (%)    Platelets 229  150 - 400 (K/uL)   DIFFERENTIAL     Status: Normal   Collection Time   10/27/11  4:47 PM      Component Value Range Comment   Neutrophils Relative 74  43 - 77 (%)    Neutro Abs 4.1  1.7 - 7.7 (K/uL)    Lymphocytes Relative 13  12 - 46 (%)    Lymphs Abs 0.7  0.7 - 4.0 (K/uL)    Monocytes Relative 12  3 - 12 (%)    Monocytes Absolute 0.7  0.1 - 1.0 (K/uL)    Eosinophils Relative 1  0 - 5 (%)    Eosinophils Absolute 0.0  0.0 - 0.7 (K/uL)    Basophils Relative 0  0 - 1 (%)    Basophils Absolute 0.0  0.0 - 0.1 (K/uL)   BASIC METABOLIC PANEL     Status: Abnormal   Collection Time   10/27/11  4:47 PM      Component Value Range Comment   Sodium 137  135 - 145 (mEq/L)    Potassium 3.7  3.5 - 5.1 (mEq/L)    Chloride 99  96 - 112 (mEq/L)    CO2 29  19 - 32 (mEq/L)    Glucose, Bld 104 (*) 70 - 99 (mg/dL)  BUN 15  6 - 23 (mg/dL)    Creatinine, Ser 1.61  0.50 - 1.10 (mg/dL)    Calcium 9.2  8.4 - 10.5 (mg/dL)    GFR calc non Af Amer 73 (*) >90 (mL/min)    GFR calc Af Amer 85 (*) >90 (mL/min)   BASIC METABOLIC PANEL      Status: Abnormal   Collection Time   10/28/11  6:30 AM      Component Value Range Comment   Sodium 139  135 - 145 (mEq/L)    Potassium 3.5  3.5 - 5.1 (mEq/L)    Chloride 103  96 - 112 (mEq/L)    CO2 24  19 - 32 (mEq/L)    Glucose, Bld 90  70 - 99 (mg/dL)    BUN 12  6 - 23 (mg/dL)    Creatinine, Ser 0.96  0.50 - 1.10 (mg/dL)    Calcium 9.0  8.4 - 10.5 (mg/dL)    GFR calc non Af Amer 76 (*) >90 (mL/min)    GFR calc Af Amer 89 (*) >90 (mL/min)   CBC     Status: Abnormal   Collection Time   10/28/11  6:30 AM      Component Value Range Comment   WBC 4.7  4.0 - 10.5 (K/uL)    RBC 3.82 (*) 3.87 - 5.11 (MIL/uL)    Hemoglobin 10.6 (*) 12.0 - 15.0 (g/dL)    HCT 04.5 (*) 40.9 - 46.0 (%)    MCV 85.6  78.0 - 100.0 (fL)    MCH 27.7  26.0 - 34.0 (pg)    MCHC 32.4  30.0 - 36.0 (g/dL)    RDW 81.1  91.4 - 78.2 (%)    Platelets 240  150 - 400 (K/uL)    Recent Results (from the past 240 hour(s))  URINE CULTURE     Status: Normal   Collection Time   10/27/11  4:44 PM      Component Value Range Status Comment   Specimen Description URINE, CATHETERIZED   Final    Special Requests NONE   Final    Setup Time [id number]   Final    Colony Count NO GROWTH   Final    Culture NO GROWTH   Final    Report Status 10/28/2011 FINAL   Final      Hospital Course:  Tamara Miller is an 78 y.o. female with history of arthritis, allergies, otherwise healthy, fell last week and developed right hip pain. Since then, she had trouble with ambulation.  She presents to [facility] where evaluation included a CT scan of her hip which showed no fracture. She had a normal white count, hemoglobin of 10.5 g per decaliter, negative urinalysis, and a chest x-ray showed no acute processes. After being started on tylenol around the clock Ms. Ruark's pain improved. She was fitted with a knee brace and the patient was discharged home under the care of her family.    Discharge Exam: Blood pressure 138/69, pulse  77, temperature 97.4 F (36.3 C), temperature source Axillary, resp. rate 20, height 5\' 2"  (1.575 m), weight 54.2 kg (119 lb 7.8 oz), SpO2 96.00%. Alert  CVs: RRR RS: CTAB Right knee without pain   Disposition: home with home health   Discharge Orders    Future Orders Please Complete By Expires   Diet general      Increase activity slowly         Follow-up Information  Schedule an appointment as soon as possible for a visit with BURNETT,BRENT A, MD.   Contact information:   [address] [place] [address] [phone]       Schedule an appointment as soon as possible for a visit with Loanne Drilling, MD.   Contact information:   [facility] [address] [place] [address] [phone]          Signed: Lonia Blood 10/29/2011, 1:35 PM

## 2011-10-29 NOTE — Progress Notes (Signed)
Orthopedic Tech Progress Note Patient Details:  Tamara Miller 09/08/1911 [id number]  Other Ortho Devices Type of Ortho Device: Knee Sleeve Ortho Device Location: patient not in room. will return    [name] 10/29/2011, 2:18 PM

## 2011-12-16 ENCOUNTER — Other Ambulatory Visit: Payer: Self-pay

## 2011-12-16 ENCOUNTER — Inpatient Hospital Stay (HOSPITAL_COMMUNITY)
Admission: EM | Admit: 2011-12-16 | Discharge: 2011-12-19 | DRG: 689 | Disposition: A | Discharge status: Home Health | Payer: Medicare Other | Attending: Internal Medicine | Admitting: Internal Medicine

## 2011-12-16 ENCOUNTER — Encounter (HOSPITAL_COMMUNITY): Payer: Self-pay | Admitting: *Deleted

## 2011-12-16 ENCOUNTER — Emergency Department (HOSPITAL_COMMUNITY): Payer: Medicare Other

## 2011-12-16 DIAGNOSIS — G928 Other toxic encephalopathy: Secondary | ICD-10-CM | POA: Diagnosis present

## 2011-12-16 DIAGNOSIS — N390 Urinary tract infection, site not specified: Principal | ICD-10-CM | POA: Diagnosis present

## 2011-12-16 DIAGNOSIS — G92 Toxic encephalopathy: Secondary | ICD-10-CM | POA: Diagnosis present

## 2011-12-16 DIAGNOSIS — I4510 Unspecified right bundle-branch block: Secondary | ICD-10-CM | POA: Diagnosis present

## 2011-12-16 DIAGNOSIS — M1610 Unilateral primary osteoarthritis, unspecified hip: Secondary | ICD-10-CM | POA: Diagnosis present

## 2011-12-16 DIAGNOSIS — R627 Adult failure to thrive: Secondary | ICD-10-CM | POA: Diagnosis present

## 2011-12-16 DIAGNOSIS — I10 Essential (primary) hypertension: Secondary | ICD-10-CM | POA: Diagnosis present

## 2011-12-16 DIAGNOSIS — E46 Unspecified protein-calorie malnutrition: Secondary | ICD-10-CM | POA: Diagnosis present

## 2011-12-16 DIAGNOSIS — E162 Hypoglycemia, unspecified: Secondary | ICD-10-CM | POA: Diagnosis present

## 2011-12-16 DIAGNOSIS — M169 Osteoarthritis of hip, unspecified: Secondary | ICD-10-CM | POA: Diagnosis present

## 2011-12-16 DIAGNOSIS — R531 Weakness: Secondary | ICD-10-CM | POA: Diagnosis present

## 2011-12-16 DIAGNOSIS — R6889 Other general symptoms and signs: Secondary | ICD-10-CM | POA: Diagnosis present

## 2011-12-16 DIAGNOSIS — D649 Anemia, unspecified: Secondary | ICD-10-CM | POA: Diagnosis present

## 2011-12-16 DIAGNOSIS — M25551 Pain in right hip: Secondary | ICD-10-CM

## 2011-12-16 DIAGNOSIS — R4182 Altered mental status, unspecified: Secondary | ICD-10-CM

## 2011-12-16 DIAGNOSIS — E876 Hypokalemia: Secondary | ICD-10-CM | POA: Diagnosis present

## 2011-12-16 DIAGNOSIS — R636 Underweight: Secondary | ICD-10-CM | POA: Diagnosis present

## 2011-12-16 DIAGNOSIS — R6251 Failure to thrive (child): Secondary | ICD-10-CM | POA: Diagnosis present

## 2011-12-16 DIAGNOSIS — G929 Unspecified toxic encephalopathy: Secondary | ICD-10-CM | POA: Diagnosis present

## 2011-12-16 HISTORY — DX: Essential (primary) hypertension: I10

## 2011-12-16 LAB — CBC
Hemoglobin: 12.9 g/dL (ref 12.0–15.0)
MCHC: 32.6 g/dL (ref 30.0–36.0)
RBC: 4.64 MIL/uL (ref 3.87–5.11)
WBC: 4.3 10*3/uL (ref 4.0–10.5)

## 2011-12-16 LAB — URINALYSIS, ROUTINE W REFLEX MICROSCOPIC
Bilirubin Urine: NEGATIVE
Glucose, UA: NEGATIVE mg/dL
Ketones, ur: NEGATIVE mg/dL
pH: 7.5 (ref 5.0–8.0)

## 2011-12-16 LAB — COMPREHENSIVE METABOLIC PANEL
AST: 17 U/L (ref 0–37)
Albumin: 3.3 g/dL — ABNORMAL LOW (ref 3.5–5.2)
Alkaline Phosphatase: 84 U/L (ref 39–117)
BUN: 12 mg/dL (ref 6–23)
Chloride: 100 mEq/L (ref 96–112)
Potassium: 3.5 mEq/L (ref 3.5–5.1)
Total Bilirubin: 0.3 mg/dL (ref 0.3–1.2)

## 2011-12-16 LAB — URINE MICROSCOPIC-ADD ON

## 2011-12-16 LAB — DIFFERENTIAL
Basophils Relative: 1 % (ref 0–1)
Lymphs Abs: 1.1 10*3/uL (ref 0.7–4.0)
Monocytes Relative: 8 % (ref 3–12)
Neutro Abs: 2.7 10*3/uL (ref 1.7–7.7)
Neutrophils Relative %: 64 % (ref 43–77)

## 2011-12-16 LAB — TROPONIN I: Troponin I: <0.3 ng/mL (ref <0.30)

## 2011-12-16 MED ORDER — AMLODIPINE BESYLATE 5 MG PO TABS
5.0000 mg | ORAL_TABLET | Freq: Every day | ORAL | Status: DC
  Administered 2011-12-16 – 2011-12-18 (×3): 5 mg via ORAL
  Filled 2011-12-16 (×4): qty 1

## 2011-12-16 MED ORDER — HYDROCODONE-ACETAMINOPHEN 5-325 MG PO TABS
1.0000 | ORAL_TABLET | ORAL | Status: DC | PRN
  Administered 2011-12-17: 1 via ORAL
  Filled 2011-12-16: qty 1

## 2011-12-16 MED ORDER — DEXTROSE 5 % IV SOLN
1.0000 g | INTRAVENOUS | Status: DC
  Administered 2011-12-17 – 2011-12-18 (×2): 1 g via INTRAVENOUS
  Filled 2011-12-16 (×3): qty 10

## 2011-12-16 MED ORDER — ENOXAPARIN SODIUM 30 MG/0.3ML ~~LOC~~ SOLN
30.0000 mg | Freq: Every day | SUBCUTANEOUS | Status: DC
  Administered 2011-12-16 – 2011-12-17 (×2): 30 mg via SUBCUTANEOUS
  Filled 2011-12-16 (×5): qty 0.3

## 2011-12-16 MED ORDER — ONDANSETRON HCL 4 MG PO TABS: 4.0000 mg | ORAL_TABLET | Freq: Four times a day (QID) | ORAL | Status: DC | PRN

## 2011-12-16 MED ORDER — ACETAMINOPHEN 650 MG RE SUPP: 650.0000 mg | Freq: Four times a day (QID) | RECTAL | Status: DC | PRN

## 2011-12-16 MED ORDER — SODIUM CHLORIDE 0.9 % IV SOLN
INTRAVENOUS | Status: DC
  Administered 2011-12-16: 22:00:00 via INTRAVENOUS

## 2011-12-16 MED ORDER — GLUCOSE 40 % PO GEL
ORAL | Status: AC
  Administered 2011-12-16: 10:00:00
  Filled 2011-12-16: qty 1

## 2011-12-16 MED ORDER — LABETALOL HCL 5 MG/ML IV SOLN
10.0000 mg | INTRAVENOUS | Status: DC | PRN
  Administered 2011-12-16: 10 mg via INTRAVENOUS
  Filled 2011-12-16: qty 4

## 2011-12-16 MED ORDER — ONDANSETRON HCL 4 MG/2ML IJ SOLN: 4.0000 mg | Freq: Four times a day (QID) | INTRAMUSCULAR | Status: DC | PRN

## 2011-12-16 MED ORDER — DIPHENHYDRAMINE HCL 25 MG PO CAPS
25.0000 mg | ORAL_CAPSULE | Freq: Every evening | ORAL | Status: DC | PRN
  Administered 2011-12-16: 25 mg via ORAL
  Filled 2011-12-16: qty 1

## 2011-12-16 MED ORDER — IBUPROFEN 800 MG PO TABS
400.0000 mg | ORAL_TABLET | Freq: Four times a day (QID) | ORAL | Status: DC | PRN
  Administered 2011-12-16: 400 mg via ORAL

## 2011-12-16 MED ORDER — LABETALOL HCL 5 MG/ML IV SOLN
10.0000 mg | Freq: Once | INTRAVENOUS | Status: AC
  Administered 2011-12-16: 10 mg via INTRAVENOUS
  Filled 2011-12-16: qty 4

## 2011-12-16 MED ORDER — DEXTROSE 5 % IV SOLN
1.0000 g | Freq: Once | INTRAVENOUS | Status: AC
  Administered 2011-12-16: 1 g via INTRAVENOUS
  Filled 2011-12-16: qty 10

## 2011-12-16 MED ORDER — POLYETHYLENE GLYCOL 3350 17 G PO PACK
17.0000 g | PACK | Freq: Every day | ORAL | Status: DC | PRN
  Filled 2011-12-16: qty 1

## 2011-12-16 MED ORDER — IBUPROFEN 200 MG PO TABS
ORAL_TABLET | ORAL | Status: AC
  Administered 2011-12-16: 400 mg via ORAL
  Filled 2011-12-16: qty 2

## 2011-12-16 MED ORDER — ACETAMINOPHEN 325 MG PO TABS
650.0000 mg | ORAL_TABLET | Freq: Four times a day (QID) | ORAL | Status: DC | PRN
Start: 2011-12-16 — End: 2011-12-19
  Administered 2011-12-17: 650 mg via ORAL
  Filled 2011-12-16: qty 2

## 2011-12-16 NOTE — ED Notes (Signed)
Upon EDP's assessment pt stating she has been having trouble with "her water." Sts she had to get to the bathroom quick this morning and it hurt when she urinated.

## 2011-12-16 NOTE — H&P (Addendum)
Hospital Admission Note Date: 12/16/2011  Patient name: Tamara Miller Medical record number: [id number] Date of birth: 09/08/1911 Age: 78 y.o. Gender: female PCP: [name], MD, MD  Attending physician: Maryruth Bun Yoana Staib, MD Emergency Contact:  Germain Osgood (daughter) [phone] Code Status:  Full code, as discussed with family  Chief Complaint: "Weakness"  History of Present Illness: Tamara Miller is an 78 y.o. female with no significant PMH who was brought to the hospital by EMS with weakness.  According to the patient's daughter, she had tried to get her up to the bathroom, and she was too lethargic to move.  She also had trouble verbalizing, and seemed to be staring off into space.  According to the patient's daughter, she seems more confused than normal, asking repetative questions.  EMS noted her to be hypertensive (BP 220/100) and hypoglycemic (CBG was 60) upon initial evaluation. She was given glucose gel with a rise in her glucose to 114. The patient's daughter reports that she seems back to her normal self at present, and the patient herself has no specific complaints, although she does endorse having had a headache earlier today.  She denies dysuria (although, according to the emergency department notes, she was complaining of dysuria earlier). She has had some frequency of urination, and the daughter is concerned that she will stopped drinking for fear of soiling herself. When she has had urinary tract infections in past, she has restricted her oral intake and ended up dehydrated. The patient's daughter also reports that her urine is darker in color and foul smelling.  Past Medical History Past Medical History  Diagnosis Date  . Arthritis   . HTN (hypertension)     Taken off BP meds by PCP when BP normalized    Past Surgical History Past Surgical History  Procedure Date  . Abdominal hysterectomy     Meds: Prior to Admission medications   Medication Sig Start  Date End Date Taking? Authorizing Provider  ibuprofen (ADVIL,MOTRIN) 200 MG tablet Take 400 mg by mouth every 6 (six) hours as needed. For pain   Yes Historical Provider, MD    Allergies: Review of patient's allergies indicates no known allergies.  Social History: History   Social History  . Marital Status: Widowed    Spouse Name: N/A    Number of Children: 3  . Years of Education: 5   Occupational History  . Retired Engineer, petroleum    Social History Main Topics  . Smoking status: Never Smoker   . Smokeless tobacco: Never Used  . Alcohol Use: No  . Drug Use: No  . Sexually Active: No   Other Topics Concern  . Not on file   Social History Narrative   Widowed.  Lives next door to daughter, who stays with her at night.  Ambulates with a walker.    Family History:  Family History  Problem Relation Age of Onset  . Heart disease Father   . Cancer Brother   . Cancer Brother   . Dementia Son     Review of Systems: Constitutional: No fever or chills;  Appetite normal; + weight loss.  HEENT: No blurry vision or diplopia, no pharyngitis or dysphagia; Wears glasses, HOH CV: No chest pain or arrhythmia.  Resp: No SOB, no cough. GI: No N/V, no diarrhea, no melena or hematochezia.  GU: No dysuria or hematuria.  MSK: + right hip and knee pain.  Neuro:  + headache, no focal neurological deficits.  Psych: No depression  or anxiety.  Endo: No thyroid disease or DM, + cold intolerance.  Skin: No rashes or lesions.  Heme: No anemia or blood dyscrasia   Physical Exam: Blood pressure 178/87, pulse 73, temperature 98.7 F (37.1 C), temperature source Oral, resp. rate 16, SpO2 100.00%. BP 178/87  Pulse 73  Temp(Src) 98.7 F (37.1 C) (Oral)  Resp 16  SpO2 100%  General Appearance:    Alert, cooperative, no distress, appears younger than stated age  Head:    Normocephalic, without obvious abnormality, atraumatic  Eyes:    PERRL, conjunctiva/corneas clear, + arcus senilis, EOM's intact    Ears:    Normal external ear canals, both ears  Nose:   Nares normal, septum midline, mucosa normal, no drainage    or sinus tenderness  Throat:   Lips, mucosa, and tongue normal; teeth with caries, missing teeth  Neck:   Supple, symmetrical, trachea midline, no adenopathy;    thyroid:  no enlargement/tenderness/nodules; no carotid   bruit or JVD  Back:     Symmetric, no curvature, ROM normal, no CVA tenderness  Lungs:     Clear to auscultation bilaterally, respirations unlabored  Chest Wall:    No tenderness or deformity   Heart:    Regular rate and rhythm, S1 and S2 normal, no murmur, rub   or gallop  Abdomen:     Soft, non-tender, bowel sounds active all four quadrants,    no masses, no organomegaly  Extremities:   Extremities normal, atraumatic, no cyanosis or edema  Pulses:   2+ and symmetric all extremities  Skin:   Skin color, texture, turgor normal, no rashes or lesions  Lymph nodes:   Cervical, supraclavicular, and axillary nodes normal  Neurologic:   CNII-XII intact, normal strength and sensation   Lab results: Basic Metabolic Panel:  Lab 12/16/11 1308  NA 137  K 3.5  CL 100  CO2 30  GLUCOSE 96  BUN 12  CREATININE 0.63  CALCIUM 9.7  MG --  PHOS --   GFR The CrCl is unknown because both a height and weight (above a minimum accepted value) are required for this calculation. Liver Function Tests:  Lab 12/16/11 1000  AST 17  ALT 7  ALKPHOS 84  BILITOT 0.3  PROT 7.0  ALBUMIN 3.3*    CBC:  Lab 12/16/11 1000  WBC 4.3  NEUTROABS 2.7  HGB 12.9  HCT 39.6  MCV 85.3  PLT 298   Cardiac Enzymes:  Lab 12/16/11 1000  CKTOTAL --  CKMB --  CKMBINDEX --  TROPONINI <0.30   Urinalysis:  Ref. Range 12/16/2011 10:14  Color, Urine Latest Range: YELLOW  YELLOW  APPearance Latest Range: CLEAR  CLEAR  Specific Gravity, Urine Latest Range: 1.005-1.030  1.012  pH Latest Range: 5.0-8.0  7.5  Glucose, UA Latest Range: NEGATIVE mg/dL NEGATIVE  Bilirubin Urine Latest  Range: NEGATIVE  NEGATIVE  Ketones, ur Latest Range: NEGATIVE mg/dL NEGATIVE  Protein Latest Range: NEGATIVE mg/dL NEGATIVE  Urobilinogen, UA Latest Range: 0.0-1.0 mg/dL 0.2  Nitrite Latest Range: NEGATIVE  NEGATIVE  Leukocytes, UA Latest Range: NEGATIVE  TRACE (A)  Hgb urine dipstick Latest Range: NEGATIVE  MODERATE (A)  WBC, UA Latest Range: <3 WBC/hpf 3-6  RBC / HPF Latest Range: <3 RBC/hpf 11-20  Squamous Epithelial / LPF Latest Range: RARE  RARE  Bacteria, UA Latest Range: RARE  FEW (A)   Imaging results:  Dg Chest 2 View  12/16/2011  *RADIOLOGY REPORT*  Clinical Data: Weakness, altered mental  status.  CHEST - 2 VIEW  Comparison: 10/27/2011  Findings: There is hyperinflation of the lungs compatible with COPD.  Small bilateral effusions.  No confluent opacity.  Stable chronic peribronchial thickening.  Tortuosity of the thoracic aorta.  No acute bony abnormality.  Degenerative changes in the thoracic spine and shoulders.  IMPRESSION: COPD.  Chronic bronchitic changes.  Small effusions.  Original Report Authenticated By: Cyndie Chime, M.D.    Assessment & Plan: Principal Problem:  *Toxic metabolic encephalopathy The patient's altered mental status appears to be from a combination of toxic and metabolic etiologies. She likely has a urinary tract infection given her presenting symptoms. She also was hypoglycemic and had malignant hypertension upon initial presentation, all of which were likely contributory. Her mental status has improved now that she has been given a dose of Rocephin, glucose gel, and labetalol to bring her blood pressure down. Active Problems:  Failure to thrive /  Generalized weakness Patient is of advanced age and appears to be failing to thrive at home. She has had multiple hospitalizations for recurrent urinary tract infection. At this point, we'll get physical and occupational therapy evaluations for further recommendations.  Pain in right hip This has been imaged  in the past and appears to be secondary to arthritis. We'll continue Motrin on an as-needed basis and and Vicodin for breakthrough pain.  UTI (lower urinary tract infection) Urinalysis does reveal some white blood cells and bacteria in the urine. Cultures have been sent. We'll continue Rocephin empirically for now.  Protein calorie malnutrition Patient appears to be underweight and has been losing weight according to the patient's daughter. We'll ask for dietary evaluation. We'll also check the patient's thyroid function.  Intolerant of cold TSH evaluation for hypothyroidism.  Malignant hypertension Patient was given a dose of labetalol with a reduction in blood pressure. She's been diagnosed with hypertension in the past, but is no longer on medicines for this. We'll start her on 5 mg of Norvasc daily and continue to use labetalol when necessary for systolic blood pressure greater than 160 or diastolic blood pressure greater than 95.  Hypoglycemia Patient has no known history of diabetes and is not on any medications that would drop her blood glucose. They simply be due to poor by mouth intake. We'll check a fasting glucose again in the morning.  Prophylaxis: Lovenox for DVT prophylaxis  Time Spent On Admission: One hour.  Jeannett Dekoning 12/16/2011, 2:14 PM Pager [phone]

## 2011-12-16 NOTE — ED Notes (Signed)
Hyman Hopes, EDP at bedside discussing plan of care.

## 2011-12-16 NOTE — Progress Notes (Signed)
ED CM noted cm consult from [facility] RN for home health needs. Pt and family chose [facility] for home services and DME Presently active with [facility] for RN and PT since 10/29/11 d/c home

## 2011-12-16 NOTE — ED Notes (Signed)
Pt from home. Lives with family. Normally alert, oriented, ambulatory. Past few days has been weak, altered mental status, asking repetitive questions. Family also reports foul odor to urine, dark urine. No Hx or meds per family. However, cbg with ems was 60, given oral glucose, up to 114. BP 220/100. And RBBB, family unaware of this.

## 2011-12-16 NOTE — ED Provider Notes (Signed)
History     CSN: [id number]  Arrival date & time 12/16/11  0910   First MD Initiated Contact with Patient 12/16/11 [phone]      Chief Complaint  Patient presents with  . Weakness  . Altered Mental Status    (Consider location/radiation/quality/duration/timing/severity/associated sxs/prior treatment) HPI  89yoF previously healthy pw AMS, weakness. Per family pt c/o urinary urgency this am and dark colored, foul smelling urine. Had two episodes of staring off into space. Too weak to get out of bed. Glu noted to be 60 pta. 114 after oral glucose gel. Pt c/o min suprapubic/RLQ pain. Denies fever/chills/cough. Min frontal headache without visual changes. C/O diffuse weakness. Denies numbness/tingling of extremities.  ED Notes, ED Provider Notes from 12/16/11 0000 to 12/16/11 09:31:02       Christene Lye, RN 12/16/2011 09:30      Pt from home. Lives with family. Normally alert, oriented, ambulatory. Past few days has been weak, altered mental status, asking repetitive questions. Family also reports foul odor to urine, dark urine. No Hx or meds per family. However, cbg with ems was 60, given oral glucose, up to 114. BP 220/100. And RBBB, family unaware of this.      Past Medical History  Diagnosis Date  . Arthritis     Past Surgical History  Procedure Date  . Abdominal hysterectomy     No family history on file.  History  Substance Use Topics  . Smoking status: Never Smoker   . Smokeless tobacco: Never Used  . Alcohol Use: No    OB History    Grav Para Term Preterm Abortions TAB SAB Ect Mult Living                  Review of Systems  All other systems reviewed and are negative.   except as noted HPI   Allergies  Review of patient's allergies indicates no known allergies.  Home Medications   Current Outpatient Rx  Name Route Sig Dispense Refill  . IBUPROFEN 200 MG PO TABS Oral Take 400 mg by mouth every 6 (six) hours as needed. For pain      BP 219/109   Pulse 70  Temp(Src) 98.7 F (37.1 C) (Oral)  Resp 16  SpO2 96%  Physical Exam  Nursing note and vitals reviewed. Constitutional: She is oriented to person, place, and time. She appears well-developed.  HENT:  Head: Atraumatic.  Mouth/Throat: Oropharynx is clear and moist.  Eyes: Conjunctivae and EOM are normal. Pupils are equal, round, and reactive to light.  Neck: Normal range of motion. Neck supple.  Cardiovascular: Normal rate, regular rhythm, normal heart sounds and intact distal pulses.   Pulmonary/Chest: Effort normal and breath sounds normal. No respiratory distress. She has no wheezes. She has no rales.       Min bibasilar crackles  Abdominal: Soft. She exhibits no distension. There is no tenderness. There is no rebound and no guarding.  Musculoskeletal: Normal range of motion.  Neurological: She is alert and oriented to person, place, and time. No cranial nerve deficit. She exhibits normal muscle tone. Coordination normal.       strength 4+/5 all extr  Skin: Skin is warm and dry. No rash noted.  Psychiatric: She has a normal mood and affect.    Date: 12/16/2011  Rate: 69  Rhythm: normal sinus rhythm  QRS Axis: normal  Intervals: normal  ST/T Wave abnormalities: nonspecific T wave changes  Conduction Disutrbances: RBBB and LAFB  Narrative Interpretation:  Old EKG Reviewed: no significant change from previous  ED Course  Procedures (including critical care time)  Labs Reviewed  COMPREHENSIVE METABOLIC PANEL - Abnormal; Notable for the following:    Albumin 3.3 (*)    GFR calc non Af Amer 72 (*)    GFR calc Af Amer 84 (*)    All other components within normal limits  URINALYSIS, ROUTINE W REFLEX MICROSCOPIC - Abnormal; Notable for the following:    Hgb urine dipstick MODERATE (*)    Leukocytes, UA TRACE (*)    All other components within normal limits  URINE MICROSCOPIC-ADD ON - Abnormal; Notable for the following:    Bacteria, UA FEW (*)    All other  components within normal limits  CBC  DIFFERENTIAL  TROPONIN I   Dg Chest 2 View  12/16/2011  *RADIOLOGY REPORT*  Clinical Data: Weakness, altered mental status.  CHEST - 2 VIEW  Comparison: 10/27/2011  Findings: There is hyperinflation of the lungs compatible with COPD.  Small bilateral effusions.  No confluent opacity.  Stable chronic peribronchial thickening.  Tortuosity of the thoracic aorta.  No acute bony abnormality.  Degenerative changes in the thoracic spine and shoulders.  IMPRESSION: COPD.  Chronic bronchitic changes.  Small effusions.  Original Report Authenticated By: Cyndie Chime, M.D.    1. Weakness   2. UTI (lower urinary tract infection)   3. Altered mental state     MDM  Patient presents with weakness, AMS from home. Baseline here. Elevated blood pressure as above. Labetalol given. Ceftriaxone. Discussed admission with [facility] hospitalist.   PMD [facility]        Forbes Cellar, MD 12/16/11 1309

## 2011-12-16 NOTE — Progress Notes (Signed)
Pt confused and does not understand she is in the hospital.  She keeps trying to get out of bed without assistance.  Notified MD.  Received new orders.  Will continue to monitor pt. Tamara Miller Captree 11:45 PM 12/16/2011

## 2011-12-16 NOTE — ED Notes (Signed)
Attempted to call report. Informed room was dirty. Sec sts she will request a stat clean on this room and is to update when available.

## 2011-12-16 NOTE — ED Notes (Signed)
Pt returned from X-ray.  

## 2011-12-17 DIAGNOSIS — I4510 Unspecified right bundle-branch block: Secondary | ICD-10-CM | POA: Diagnosis present

## 2011-12-17 DIAGNOSIS — E876 Hypokalemia: Secondary | ICD-10-CM | POA: Diagnosis present

## 2011-12-17 LAB — BASIC METABOLIC PANEL
CO2: 26 mEq/L (ref 19–32)
Chloride: 104 mEq/L (ref 96–112)
Creatinine, Ser: 0.78 mg/dL (ref 0.50–1.10)
GFR calc Af Amer: 78 mL/min — ABNORMAL LOW (ref >90)
Sodium: 139 mEq/L (ref 135–145)

## 2011-12-17 LAB — TSH: TSH: 2.957 u[IU]/mL (ref 0.350–4.500)

## 2011-12-17 MED ORDER — POTASSIUM CHLORIDE IN NACL 40-0.9 MEQ/L-% IV SOLN
INTRAVENOUS | Status: DC
  Administered 2011-12-17: 23:00:00 via INTRAVENOUS
  Administered 2011-12-17: 75 mL/h via INTRAVENOUS
  Administered 2011-12-18 – 2011-12-19 (×2): via INTRAVENOUS
  Filled 2011-12-17 (×6): qty 1000

## 2011-12-17 MED ORDER — HALOPERIDOL 2 MG PO TABS
2.0000 mg | ORAL_TABLET | Freq: Three times a day (TID) | ORAL | Status: DC | PRN
  Filled 2011-12-17: qty 1

## 2011-12-17 NOTE — Progress Notes (Signed)
PROGRESS NOTE  Tamara Miller ZOX:[id number] DOB: 09/08/1911 DOA: 12/16/2011 PCP: Tamara Lek, MD, MD  Brief narrative: Tamara Miller is an 78 y.o. female with no significant PMH who was brought to the hospital by EMS with weakness on 12/16/11.  She was felt to have toxic/metabolic encephalopathy from a UTI, hypoglycemia, and malignant hypertension.    Assessment/Plan: *Toxic metabolic encephalopathy  The patient's altered mental status appears to be from a combination of toxic and metabolic etiologies. She likely has a urinary tract infection given her presenting symptoms. She also was hypoglycemic and had malignant hypertension upon initial presentation, all of which were likely contributory. Her mental status improved once she was given a dose of Rocephin, glucose gel, and labetalol to bring her blood pressure down in the ER. She had some delirium over night, and remains confused this morning. Active Problems:  Failure to thrive / Generalized weakness  Patient is of advanced age and appears to be failing to thrive at home. She has had multiple hospitalizations for recurrent urinary tract infection. At this point, we'll get physical and occupational therapy evaluations for further recommendations.  Pain in right hip  This has been imaged in the past and appears to be secondary to arthritis. We'll continue Motrin on an as-needed basis and and Vicodin for breakthrough pain.  UTI (lower urinary tract infection)  Urinalysis does reveal some white blood cells and bacteria in the urine. Cultures have been sent, and are pending. We'll continue Rocephin empirically for now.  Protein calorie malnutrition  Patient appears to be underweight and has been losing weight according to the patient's daughter. We'll ask for dietary evaluation. We'll also check the patient's thyroid function.  Intolerant of cold  TSH evaluation for hypothyroidism.  Malignant hypertension  Patient was given a dose  of labetalol in the ER with a reduction in blood pressure. We started her on 5 mg of Norvasc daily and will continue to use labetalol when necessary for systolic blood pressure greater than 160 or diastolic blood pressure greater than 95. BP improved on Norvasc.  Continue to monitor. Hypoglycemia  Patient has no known history of diabetes and is not on any medications that would drop her blood glucose. They simply be due to poor by mouth intake. We'll check a fasting glucose again in the morning.  No further episodes noted.  Right bundle branch block No old EKG's for comparison, but the patient is not having any complaints of chest pain or symptoms suggestive of ACS.  Troponin negative on admission.  Hypokalemia Replete in IVF.   Code Status: Full code Family Communication: Tamara Miller (daughter) [phone] Disposition Plan: Home versus SNF for rehab, depending on hospital course.  Consultants:  None  Procedures:  12 Lead EKG 12/16/11: SINUS RHYTHM ~ normal P axis, V-rate 50- 99 RBBB AND LAFB ~ QRSd >189mS, axis(-40,240) PROBABLE LEFT VENTRICULAR HYPERTROPHY ~ (RaVL+SV3)xQRSd >300  Antibiotics:  Rocephin 12/16/11 --->   Subjective  Tamara Miller is very confused this morning.  She    Objective    Interim History: Tamara Miller was confused through the night.     Objective: Filed Vitals:   12/16/11 1919 12/16/11 1930 12/16/11 2048 12/17/11 0434  BP:  140/87 159/87 153/73  Pulse: 76 68 77 70  Temp:  98.4 F (36.9 C) 98.3 F (36.8 C) 98.2 F (36.8 C)  TempSrc:  Oral Oral Oral  Resp:  20 18 16   Weight:   52.935 kg (116 lb 11.2 oz)   SpO2:  100% 98% 98% 98%    Intake/Output Summary (Last 24 hours) at 12/17/11 0838 Last data filed at 12/17/11 8295  Gross per 24 hour  Intake    880 ml  Output      0 ml  Net    880 ml    Exam: Gen:  NAD Cardiovascular:  RRR, No M/R/G Respiratory: Lungs CTAB Gastrointestinal: Abdomen soft, NT/ND with normal active bowel  sounds. Extremities: No C/E/C  Data Reviewed: Basic Metabolic Panel:  Lab 12/17/11 6213 12/16/11 1000  NA 139 137  K 3.3* 3.5  CL 104 100  CO2 26 30  GLUCOSE 93 96  BUN 18 12  CREATININE 0.78 0.63  CALCIUM 9.1 9.7  MG -- --  PHOS -- --   Liver Function Tests:  Lab 12/16/11 1000  AST 17  ALT 7  ALKPHOS 84  BILITOT 0.3  PROT 7.0  ALBUMIN 3.3*   CBC:  Lab 12/16/11 1000  WBC 4.3  NEUTROABS 2.7  HGB 12.9  HCT 39.6  MCV 85.3  PLT 298   Cardiac Enzymes:  Lab 12/16/11 1000  CKTOTAL --  CKMB --  CKMBINDEX --  TROPONINI <0.30     Studies: Dg Chest 2 View  12/16/2011  *RADIOLOGY REPORT*  Clinical Data: Weakness, altered mental status.  CHEST - 2 VIEW  Comparison: 10/27/2011  Findings: There is hyperinflation of the lungs compatible with COPD.  Small bilateral effusions.  No confluent opacity.  Stable chronic peribronchial thickening.  Tortuosity of the thoracic aorta.  No acute bony abnormality.  Degenerative changes in the thoracic spine and shoulders.  IMPRESSION: COPD.  Chronic bronchitic changes.  Small effusions.  Original Report Authenticated By: Cyndie Chime, M.D.    Scheduled Meds:   . amLODipine  5 mg Oral Daily  . cefTRIAXone (ROCEPHIN)  IV  1 g Intravenous Once  . cefTRIAXone (ROCEPHIN)  IV  1 g Intravenous Q24H  . dextrose      . enoxaparin  30 mg Subcutaneous Q2000  . labetalol  10 mg Intravenous Once   Continuous Infusions:   . sodium chloride 75 mL/hr at 12/16/11 2205      LOS: 1 day   Tamara Aldo, MD Pager [phone]  12/17/2011, 8:38 AM

## 2011-12-17 NOTE — Evaluation (Signed)
Physical Therapy Evaluation Patient Details Name: Tamara Miller MRN: [id number] DOB: 09/08/1911 Today's Date: 12/17/2011  Problem List:  Patient Active Problem List  Diagnoses  . Failure to thrive  . Arthritis  . Anemia  . Generalized weakness  . Pain in right hip  . Toxic metabolic encephalopathy  . UTI (lower urinary tract infection)  . Protein calorie malnutrition  . Intolerant of cold  . Malignant hypertension  . Hypoglycemia  . RBBB (right bundle branch block)  . Hypokalemia    Past Medical History:  Past Medical History  Diagnosis Date  . Arthritis   . HTN (hypertension)     Taken off BP meds by PCP when BP normalized   Past Surgical History:  Past Surgical History  Procedure Date  . Abdominal hysterectomy     PT Assessment/Plan/Recommendation PT Assessment Clinical Impression Statement: Pt presents with toxic metabolic encephalopathy with decreased strength and mobility.  Co-treated with OT. Pt tolerated some steps from bed to 3in1 and to recliner.  Pt amb better with 2 person hand held assist rather than with RW due to decreased cognition.  Pt will benefit from skilled PT in acute venue in order to address deficits.  PT recommends SNF for follow up therapy to ensure patient safety, decrease fall risk and decrease burden of care.   PT Recommendation/Assessment: Patient will need skilled PT in the acute care venue PT Problem List: Decreased strength;Decreased range of motion;Decreased activity tolerance;Decreased balance;Decreased mobility;Decreased coordination;Decreased cognition;Decreased safety awareness;Decreased knowledge of precautions Barriers to Discharge: Decreased caregiver support PT Therapy Diagnosis : Difficulty walking;Abnormality of gait;Generalized weakness PT Plan PT Frequency: Min 3X/week PT Treatment/Interventions: DME instruction;Gait training;Functional mobility training;Therapeutic activities;Therapeutic exercise;Patient/family  education PT Recommendation Follow Up Recommendations: Skilled nursing facility;Supervision/Assistance - 24 hour Equipment Recommended: Defer to next venue PT Goals  Acute Rehab PT Goals PT Goal Formulation: Patient unable to participate in goal setting Time For Goal Achievement: 2 weeks Pt will go Supine/Side to Sit: with supervision PT Goal: Supine/Side to Sit - Progress: Goal set today Pt will go Sit to Supine/Side: with supervision PT Goal: Sit to Supine/Side - Progress: Goal set today Pt will go Sit to Stand: with supervision PT Goal: Sit to Stand - Progress: Goal set today Pt will go Stand to Sit: with supervision PT Goal: Stand to Sit - Progress: Goal set today Pt will Ambulate: 16 - 50 feet;with min assist;with least restrictive assistive device PT Goal: Ambulate - Progress: Goal set today  PT Evaluation Precautions/Restrictions  Precautions Precautions: Fall Required Braces or Orthoses: No Restrictions Weight Bearing Restrictions: No Prior Functioning  Home Living Lives With: Other (Comment);Alone (per chart, pt lives alone and daughter checks in at night) Receives Help From: Family Additional Comments: Unable to obtain PLOF due to pt very confused and poor historian.  Prior Function Comments: Unable to obtain info due to above. No family present.  Cognition Cognition Arousal/Alertness: Awake/alert Overall Cognitive Status: Difficult to assess (No caregiver/family present to determine baseline functionin) Orientation Level: Oriented to person;Disoriented to place;Disoriented to time;Disoriented to situation Cognition - Other Comments: Pt impulsive, slow to process and has difficulty following 1 step commands. Sensation/Coordination Sensation Light Touch: Appears Intact Coordination Gross Motor Movements are Fluid and Coordinated: Yes Extremity Assessment RUE Assessment RUE Assessment: Within Functional Limits LUE Assessment LUE Assessment: Within Functional  Limits RLE Assessment RLE Assessment: Exceptions to [facility] RLE Strength RLE Overall Strength Comments: Unable to test formally due to decreased cognition, grossly WFL  LLE Assessment LLE Assessment: Exceptions  to [facility] LLE Strength LLE Overall Strength Comments: Unable to test formally due to decreased cognition, grossly WFL.  Mobility (including Balance) Bed Mobility Bed Mobility: Yes Supine to Sit: 1: +2 Total assist;Patient percentage (comment);With rails Supine to Sit Details (indicate cue type and reason): Pt assist 75%.  Pt initiated use of rails to attain sitting position.  Some assist for LE and trunk with cues for attending to task.  Transfers Transfers: Yes Sit to Stand: 1: +2 Total assist;Patient percentage (comment);From elevated surface;From bed;With upper extremity assist;From chair/3-in-1;With armrests Sit to Stand Details (indicate cue type and reason): Pt assist 50%.  Requires max cuing for safety and to attend to task.  Pt with anterior lean initially, corrected with +2 HHA.  Performed x 2 from bed and from 3in1.  Stand to Sit: 1: +2 Total assist;Patient percentage (comment);With upper extremity assist;With armrests;To chair/3-in-1 Stand to Sit Details: Pt assist 50%.  Assist for controlled descent with manual and verbal cues for hand placement.  Ambulation/Gait Ambulation/Gait: Yes Ambulation/Gait Assistance: 1: +2 Total assist;Patient percentage (comment) Ambulation/Gait Assistance Details (indicate cue type and reason): Pt assist 60%.  +2 for safety and 2 person HHA.  Pt demos scissoring gait pattern, took some steps from 3in1 to recliner.  Ambulation Distance (Feet): 8 Feet Assistive device: 2 person hand held assist Gait Pattern: Scissoring;Trunk flexed;Decreased stride length Stairs: No Wheelchair Mobility Wheelchair Mobility: No    Exercise    End of Session PT - End of Session Equipment Utilized During Treatment: Gait belt Activity Tolerance: Patient tolerated  treatment well Patient left: in chair;with call bell in reach;Other (comment) (with sitter present) Nurse Communication: Mobility status for transfers;Mobility status for ambulation General Behavior During Session: [facility] for tasks performed Cognition: [facility] for tasks performed  Page, Meribeth Mattes 12/17/2011, 1:24 PM

## 2011-12-17 NOTE — Progress Notes (Signed)
Spoke with patient and family at bedside. State patient lives at home with dtr at times but also has a home next door to dtr. Very supportive family, patient currently very pleasant and cooperative but still appears somewhat confused. Per dtr have used advanced home care in the past for [facility] services, recommendations for PT/OT, request an aide as well. Dtr request a shower chair to assist with bath. Contacted Darl Pikes with [facility] to arrange, she will talk with Lawernce Keas about DME needs. Awaiting final orders. Marland Kitchen

## 2011-12-17 NOTE — Progress Notes (Signed)
INITIAL ADULT NUTRITION ASSESSMENT Date: 12/17/2011   Time: 11:49 AM Reason for Assessment: Consult  ASSESSMENT: Female 78 y.o.  Dx: Toxic metabolic encephalopathy  Hx:  Past Medical History  Diagnosis Date  . Arthritis   . HTN (hypertension)     Taken off BP meds by PCP when BP normalized   Related Meds:  Scheduled Meds:   . amLODipine  5 mg Oral Daily  . cefTRIAXone (ROCEPHIN)  IV  1 g Intravenous Once  . cefTRIAXone (ROCEPHIN)  IV  1 g Intravenous Q24H  . enoxaparin  30 mg Subcutaneous Q2000  . labetalol  10 mg Intravenous Once   Continuous Infusions:   . 0.9 % NaCl with KCl 40 mEq / L 75 mL/hr (12/17/11 0932)  . DISCONTD: sodium chloride 75 mL/hr at 12/16/11 2205   PRN Meds:.acetaminophen, acetaminophen, diphenhydrAMINE, HYDROcodone-acetaminophen, ibuprofen, labetalol, ondansetron (ZOFRAN) IV, ondansetron, polyethylene glycol  Ht:  5 foot 2 inches (1.581m) on 10/27/11  Wt: 116 lb 11.2 oz (52.935 kg)  Ideal Wt:    110 lb % Ideal Wt: 116  Usual Wt: 119 lb in January 2013 % Usual Wt: 97  Body mass index of 21.3 kg/m^2  There is no height on file to calculate BMI.  Food/Nutrition Related Hx: Pt from home with family. Admitted with weakness and altered mental status. Daughter expressed concern on admission that pt would stop drinking for fear of soiling herself and stop eating/drinking in the past when she had an UTI, however pt ate 80% of breakfast this morning. Pt with sitter in room. Met with pt and daughter who reports pt typically eats well PTA - 3 meals/day of chopped meats, without any difficulty swallowing, not on any nutritional supplements, and relatively stable weight. Noted pt's weight has gone down 3 pounds since January 2013 admission but this may more fluid related versus poor intake as pt eating well PTA.   Labs:  CMP     Component Value Date/Time   NA 139 12/17/2011 0410   K 3.3* 12/17/2011 0410   CL 104 12/17/2011 0410   CO2 26 12/17/2011 0410   GLUCOSE 93 12/17/2011 0410   BUN 18 12/17/2011 0410   CREATININE 0.78 12/17/2011 0410   CALCIUM 9.1 12/17/2011 0410   PROT 7.0 12/16/2011 1000   ALBUMIN 3.3* 12/16/2011 1000   AST 17 12/16/2011 1000   ALT 7 12/16/2011 1000   ALKPHOS 84 12/16/2011 1000   BILITOT 0.3 12/16/2011 1000   GFRNONAA 67* 12/17/2011 0410   GFRAA 78* 12/17/2011 0410    Intake/Output Summary (Last 24 hours) at 12/17/11 1201 Last data filed at 12/17/11 1000  Gross per 24 hour  Intake    880 ml  Output    200 ml  Net    680 ml   Last BM - 12/16/11  Diet Order: Regular   IVF:    0.9 % NaCl with KCl 40 mEq / L Last Rate: 75 mL/hr (12/17/11 0932)  DISCONTD: sodium chloride Last Rate: 75 mL/hr at 12/16/11 2205    Estimated Nutritional Needs:   Kcal: 1600-1900 Protein: 65-80g Fluid: 1.6-1.9L  NUTRITION DIAGNOSIS: -Predicted suboptimal energy intake (NI-1.6).  Status: Ongoing -Pt with BMI of 21, underweight for age and build   RELATED TO: altered mental status  AS EVIDENCE BY: MD notes  MONITORING/EVALUATION(Goals): Pt to consume >90% of meals.   EDUCATION NEEDS: -No education needs identified at this time  INTERVENTION: Encouraged continued excellent intake. Will monitor.   Dietitian #: [phone]  DOCUMENTATION  CODES Per approved criteria  -Underweight    Marshall Cork 12/17/2011, 11:49 AM

## 2011-12-17 NOTE — Evaluation (Signed)
Occupational Therapy Evaluation Patient Details Name: Tamara Miller MRN: [id number] DOB: 09/08/1911 Today's Date: 12/17/2011  Problem List:  Patient Active Problem List  Diagnoses  . Failure to thrive  . Arthritis  . Anemia  . Generalized weakness  . Pain in right hip  . Toxic metabolic encephalopathy  . UTI (lower urinary tract infection)  . Protein calorie malnutrition  . Intolerant of cold  . Malignant hypertension  . Hypoglycemia  . RBBB (right bundle branch block)  . Hypokalemia    Past Medical History:  Past Medical History  Diagnosis Date  . Arthritis   . HTN (hypertension)     Taken off BP meds by PCP when BP normalized   Past Surgical History:  Past Surgical History  Procedure Date  . Abdominal hysterectomy     OT Assessment/Plan/Recommendation OT Assessment Clinical Impression Statement: Pt is a 78 yo demented female diagnosed with toxic metabolic encephalopathy. Unsure what pt's baseline functional performance is. No family present. Will see on a trial basis to maximize I w/BADLs in prep for d/c to next venue of care or home with 24/7 A and HHOT. OT Recommendation/Assessment: Patient will need skilled OT in the acute care venue OT Problem List: Decreased activity tolerance;Decreased cognition;Decreased safety awareness;Decreased knowledge of use of DME or AE;Impaired balance (sitting and/or standing) Barriers to Discharge: Inaccessible home environment;Decreased caregiver support OT Therapy Diagnosis : Generalized weakness OT Plan OT Frequency: Min 1X/week OT Treatment/Interventions: Self-care/ADL training;Therapeutic activities;Patient/family education;DME and/or AE instruction OT Recommendation Follow Up Recommendations: Skilled nursing facility;Supervision/Assistance - 24 hour & HHOT (Pt would require round the clock supervision for safe d/c home.) Equipment Recommended: Other (comment) (TBD) Individuals Consulted Consulted and Agree with Results  and Recommendations: Patient unable/family or caregiver not available OT Goals Acute Rehab OT Goals OT Goal Formulation: Patient unable to participate in goal setting Time For Goal Achievement: 2 weeks ADL Goals Pt Will Perform Eating: with set-up;with cueing (comment type and amount);Sitting, chair;Supported;Unsupported (w/min amt of multimodal cues to initiate & followthrough) ADL Goal: Eating - Progress: Goal set today Pt Will Perform Grooming: with set-up;Sitting, chair;Unsupported;Supported;with cueing (comment type and amount) (w/min amt of multimodal cues to initiate & followthrough) ADL Goal: Grooming - Progress: Goal set today Pt Will Transfer to Toilet: with min assist;3-in-1;Stand pivot transfer;Ambulation ADL Goal: Toilet Transfer - Progress: Goal set today Pt Will Perform Toileting - Clothing Manipulation: with mod assist;Standing;Other (comment) (w/min amt of multimodal cues to initiate & followthrough) ADL Goal: Toileting - Clothing Manipulation - Progress: Goal set today Pt Will Perform Toileting - Hygiene: with mod assist;Sit to stand from 3-in-1/toilet;Other (comment) (w/min amt of multimodal cues to initiate & followthrough) ADL Goal: Toileting - Hygiene - Progress: Goal set today  OT Evaluation Precautions/Restrictions  Precautions Precautions: Fall Prior Functioning Home Living Lives With: Alone (per chart, pt lives alone and daughter checks in at night.) Receives Help From: Friend(s) Additional Comments: Unable to obtain info. Pt very confused and a poor historian. Prior Function Comments: Unable to obtain info due to above. No family present. ADL ADL Eating/Feeding: Simulated;Minimal assistance (per nurse tech) Where Assessed - Eating/Feeding: Bed level Grooming: Performed;Wash/dry face;Minimal assistance Where Assessed - Grooming: Sitting, bed;Unsupported Upper Body Bathing: Simulated;Maximal assistance Where Assessed - Upper Body Bathing: Sitting,  bed;Unsupported Lower Body Bathing: Simulated;+2 Total assistance;Comment for patient % (Pt 10%) Where Assessed - Lower Body Bathing: Sit to stand from bed Upper Body Dressing: Simulated;Maximal assistance Where Assessed - Upper Body Dressing: Sitting, bed;Supported Lower Body Dressing: Simulated;+2 Total  assistance;Comment for patient % (Pt 0%) Where Assessed - Lower Body Dressing: Sit to stand from bed Toilet Transfer: Performed;+2 Total assistance;Comment for patient % (Pt 50%. Pt unable to safely manipulate RW.) Toilet Transfer Method: Stand pivot Toilet Transfer Equipment: Bedside commode Toileting - Clothing Manipulation: Performed;+2 Total assistance;Comment for patient % (Pt 0%) Where Assessed - Toileting Clothing Manipulation: Sit to stand from 3-in-1 or toilet Toileting - Hygiene: Performed;+2 Total assistance;Comment for patient % (Pt 0%) Where Assessed - Toileting Hygiene: Sit to stand from 3-in-1 or toilet Tub/Shower Transfer: Not assessed Tub/Shower Transfer Method: Not assessed Equipment Used: Other (comment);Rolling walker (BSC) ADL Comments: Pt requires max multimodal cues to initiate and followthrough with any functional tasks. Highly distractible, impulsive, incontinet of bladder. Is able to follow simple 1 step commands with increased time. Vision/Perception    Cognition Cognition Arousal/Alertness: Awake/alert Overall Cognitive Status: No family/caregiver present to determine baseline cognitive functioning Orientation Level: Oriented to person;Disoriented to place;Disoriented to time;Disoriented to situation Cognition - Other Comments: Pt impulsive, slow to process and has difficulty following 1 step commands. Sensation/Coordination   Extremity Assessment RUE Assessment RUE Assessment: Within Functional Limits LUE Assessment LUE Assessment: Within Functional Limits Mobility  Bed Mobility Bed Mobility: Yes Supine to Sit: 1: +2 Total assist;Patient percentage  (comment);With rails (Pt 75 % ) Supine to Sit Details (indicate cue type and reason): Pt did initiate the use of rails to A in pulling UB to midline position. A needed to scoot to EOB Transfers Transfers: Yes Sit to Stand: 1: +2 Total assist;Patient percentage (comment);From elevated surface;With upper extremity assist;From bed;From chair/3-in-1 (Pt 50%) Sit to Stand Details (indicate cue type and reason): max multimodal cues needed for safety. Pt initially presents with an anterior lean but is able to correct with HHA + 2 for safety to transfer to chair. Pt presents with scissoring of BLEs when ambulating to chair. Stand to Sit: 1: +2 Total assist;Patient percentage (comment);With upper extremity assist;With armrests;To chair/3-in-1 (Pt 50%) Stand to Sit Details: A needed to control descent to chair. Exercises   End of Session OT - End of Session Equipment Utilized During Treatment: Gait belt Activity Tolerance: Patient tolerated treatment well Patient left: in chair;with call bell in reach;Other (comment) (sitter present.) Nurse Communication: Mobility status for transfers General Behavior During Session: [facility] for tasks performed Cognition: Impaired, at baseline   Sally Reimers A OTR/L [phone] 12/17/2011, 11:22 AM

## 2011-12-17 NOTE — Progress Notes (Signed)
UR complete 

## 2011-12-18 DIAGNOSIS — R636 Underweight: Secondary | ICD-10-CM | POA: Diagnosis present

## 2011-12-18 DIAGNOSIS — D649 Anemia, unspecified: Secondary | ICD-10-CM | POA: Diagnosis present

## 2011-12-18 LAB — TSH: TSH: 3.177 u[IU]/mL (ref 0.350–4.500)

## 2011-12-18 LAB — CBC
HCT: 33.5 % — ABNORMAL LOW (ref 36.0–46.0)
Platelets: 271 10*3/uL (ref 150–400)
RBC: 3.92 MIL/uL (ref 3.87–5.11)
RDW: 14.9 % (ref 11.5–15.5)
WBC: 3.4 10*3/uL — ABNORMAL LOW (ref 4.0–10.5)

## 2011-12-18 LAB — URINE CULTURE: Culture  Setup Time: 201303150047

## 2011-12-18 LAB — BASIC METABOLIC PANEL
Calcium: 8.9 mg/dL (ref 8.4–10.5)
Chloride: 108 mEq/L (ref 96–112)
Creatinine, Ser: 0.62 mg/dL (ref 0.50–1.10)
GFR calc Af Amer: 84 mL/min — ABNORMAL LOW (ref >90)
Sodium: 140 mEq/L (ref 135–145)

## 2011-12-18 MED ORDER — MAGIC MOUTHWASH: 10.0000 mL | ORAL | Status: DC | PRN

## 2011-12-18 MED ORDER — AMLODIPINE BESYLATE 10 MG PO TABS
10.0000 mg | ORAL_TABLET | Freq: Every day | ORAL | Status: DC
  Administered 2011-12-19: 10 mg via ORAL
  Filled 2011-12-18: qty 1

## 2011-12-18 MED ORDER — MAGIC MOUTHWASH
5.0000 mL | ORAL | Status: DC | PRN
  Filled 2011-12-18: qty 10

## 2011-12-18 NOTE — Progress Notes (Signed)
PROGRESS NOTE  Tamara Miller ZHY:[id number] DOB: 09/08/1911 DOA: 12/16/2011 PCP: [name], MD, MD  Brief narrative: Tamara Miller is an 78 y.o. female with no significant PMH who was brought to the hospital by EMS with weakness on 12/16/11.  She was felt to have toxic/metabolic encephalopathy from a UTI, hypoglycemia, and malignant hypertension.    Assessment/Plan: *Toxic metabolic encephalopathy  The patient's altered mental status appears to be from a combination of toxic and metabolic etiologies. She likely has a urinary tract infection given her presenting symptoms. She also was hypoglycemic and had malignant hypertension upon initial presentation, all of which were likely contributory. Her mental status improved once she was given a dose of Rocephin, glucose gel, and labetalol to bring her blood pressure down in the ER. She had some delirium throughout her hospital stay, which is likely being exacerbated by the unfamiliar environment. Active Problems:  Failure to thrive / Generalized weakness  Patient is of advanced age and appears to be failing to thrive at home. She has had multiple hospitalizations for recurrent urinary tract infection. We have obtain physical and occupational therapy evaluations for further recommendations, with skilled nursing home placement versus home with 24-hour supervision recommended.  Pain in right hip  This has been imaged in the past and appears to be secondary to arthritis. We'll continue Motrin on an as-needed basis and and Vicodin for breakthrough pain.  UTI (lower urinary tract infection)  Urinalysis does reveal some white blood cells and bacteria in the urine. Cultures have been sent, and are pending. We'll continue Rocephin empirically for now.  Protein calorie malnutrition / underweight / BMI 21 Patient appears to be underweight and has been losing weight according to the patient's daughter. She was seen by the dietitian on 12/17/2011 with  recommendations for ongoing encouragement of by mouth intake. Thyroid function was normal.  Intolerant of cold  Thyroid function was normal.  Malignant hypertension  Patient was given a dose of labetalol in the ER with a reduction in blood pressure. We started her on 5 mg of Norvasc daily and will continue to use labetalol when necessary for systolic blood pressure greater than 160 or diastolic blood pressure greater than 95. BP improved on Norvasc, but not optimally controlled. We will increase Norvasc to 10 mg daily.  Continue to monitor. Hypoglycemia  Patient has no known history of diabetes and is not on any medications that would drop her blood glucose. May simply be due to poor by mouth intake. We'll check a fasting glucose again in the morning.  No further episodes noted.  Right bundle branch block No old EKG's for comparison, but the patient is not having any complaints of chest pain or symptoms suggestive of ACS.  Troponin negative on admission.  Hypokalemia Repleted in IVF.  Normocytic anemia Patient was not anemic on initial presentation and the drop in her hemoglobin is likely due to dilutional factors. Her anemia is likely from chronic disease and bone marrow fibrosis given her advanced age.   Code Status: Full code Family Communication: Tamara Miller (daughter) [phone], daughter updated by telephone 12/18/2011. Disposition Plan: Home versus SNF for rehab, depending on hospital course.  Consultants:  Physical therapy: Skilled nursing home placement versus home with 24-hour assistance  Occupational therapy: Skilled nursing home placement; home health OT if discharged home  Dietitian: Underweight. Encouraged ongoing intake.  Procedures:  12 Lead EKG 12/16/11: SINUS RHYTHM ~ normal P axis, V-rate 50- 99 RBBB AND LAFB ~ QRSd >163mS, axis(-40,240)  PROBABLE LEFT VENTRICULAR HYPERTROPHY ~ (RaVL+SV3)xQRSd >300  Antibiotics:  Rocephin 12/16/11 --->   Subjective  Mrs.  Coll is calmer this morning.  She is still disoriented. She has some occasional back pain and pain in the hands but otherwise has no complaints. She denies dysuria. She does worry about having to use the bathroom too frequently.      Objective    Interim History: [name] was confused through the night.     Objective: Filed Vitals:   12/17/11 0434 12/17/11 1433 12/17/11 2052 12/18/11 0545  BP: 153/73 160/81 135/67 156/92  Pulse: 70 77 77 80  Temp: 98.2 F (36.8 C) 97.2 F (36.2 C) 97.5 F (36.4 C) 97.4 F (36.3 C)  TempSrc: Oral Oral Oral Oral  Resp: 16 15 16 16   Height:  5\' 5"  (1.651 m)    Weight:      SpO2: 98% 98% 98% 98%    Intake/Output Summary (Last 24 hours) at 12/18/11 0956 Last data filed at 12/18/11 0900  Gross per 24 hour  Intake   1546 ml  Output    425 ml  Net   1121 ml    Exam: Gen:  NAD Cardiovascular:  RRR, No M/R/G Respiratory: Lungs CTAB Gastrointestinal: Abdomen soft, NT/ND with normal active bowel sounds. Extremities: No C/E/C  Data Reviewed: Basic Metabolic Panel:  Lab 12/18/11 1610 12/17/11 0410 12/16/11 1000  NA 140 139 137  K 4.4 3.3* --  CL 108 104 100  CO2 26 26 30   GLUCOSE 82 93 96  BUN 17 18 12   CREATININE 0.62 0.78 0.63  CALCIUM 8.9 9.1 9.7  MG -- -- --  PHOS -- -- --   Liver Function Tests:  Lab 12/16/11 1000  AST 17  ALT 7  ALKPHOS 84  BILITOT 0.3  PROT 7.0  ALBUMIN 3.3*   CBC:  Lab 12/18/11 0400 12/16/11 1000  WBC 3.4* 4.3  NEUTROABS -- 2.7  HGB 10.9* 12.9  HCT 33.5* 39.6  MCV 85.5 85.3  PLT 271 298   Cardiac Enzymes:  Lab 12/16/11 1000  CKTOTAL --  CKMB --  CKMBINDEX --  TROPONINI <0.30     Ref. Range 12/17/2011 04:10  TSH Latest Range: 0.350-4.500 uIU/mL 2.957    Studies: Dg Chest 2 View  12/16/2011  *RADIOLOGY REPORT*  Clinical Data: Weakness, altered mental status.  CHEST - 2 VIEW  Comparison: 10/27/2011  Findings: There is hyperinflation of the lungs compatible with COPD.  Small  bilateral effusions.  No confluent opacity.  Stable chronic peribronchial thickening.  Tortuosity of the thoracic aorta.  No acute bony abnormality.  Degenerative changes in the thoracic spine and shoulders.  IMPRESSION: COPD.  Chronic bronchitic changes.  Small effusions.  Original Report Authenticated By: Cyndie Chime, M.D.    Scheduled Meds:    . amLODipine  5 mg Oral Daily  . cefTRIAXone (ROCEPHIN)  IV  1 g Intravenous Q24H  . enoxaparin  30 mg Subcutaneous Q2000   Continuous Infusions:    . 0.9 % NaCl with KCl 40 mEq / L 75 mL/hr at 12/17/11 2244      LOS: 2 days   Hillery Aldo, MD Pager [phone]  12/18/2011, 9:56 AM

## 2011-12-19 MED ORDER — CEFUROXIME AXETIL 250 MG PO TABS: 250.0000 mg | ORAL_TABLET | Freq: Two times a day (BID) | ORAL | Status: AC

## 2011-12-19 MED ORDER — AMLODIPINE BESYLATE 10 MG PO TABS: 10.0000 mg | ORAL_TABLET | Freq: Every day | ORAL | Status: DC

## 2011-12-19 MED ORDER — PHENAZOPYRIDINE HCL 100 MG PO TABS: 100.0000 mg | ORAL_TABLET | Freq: Three times a day (TID) | ORAL | Status: AC | PRN

## 2011-12-19 NOTE — Discharge Summary (Signed)
Physician Discharge Summary  Patient ID: Tamara Miller MRN: [id number] DOB/AGE: 76/03/1911 76 y.o.  Admit date: 12/16/2011 Discharge date: 12/19/2011  Primary Care Physician:  [name], MD, MD   Discharge Diagnoses:    Present on Admission:  .Failure to thrive .Generalized weakness .Toxic metabolic encephalopathy .UTI (lower urinary tract infection) .Protein calorie malnutrition .Intolerant of cold .Malignant hypertension .Pain in right hip .Hypoglycemia .RBBB (right bundle branch block) .Hypokalemia .Underweight .Normocytic anemia  Discharge Medications:  Medication List  As of 12/19/2011  7:19 AM   TAKE these medications         amLODipine 10 MG tablet   Commonly known as: NORVASC   Take 1 tablet (10 mg total) by mouth daily.      cefUROXime 250 MG tablet   Commonly known as: CEFTIN   Take 1 tablet (250 mg total) by mouth 2 (two) times daily.      ibuprofen 200 MG tablet   Commonly known as: ADVIL,MOTRIN   Take 400 mg by mouth every 6 (six) hours as needed. For pain      phenazopyridine 100 MG tablet   Commonly known as: PYRIDIUM   Take 1 tablet (100 mg total) by mouth 3 (three) times daily as needed for pain (Use for painful urination or frequent urination as needed.).             Disposition and Follow-up: The patient is being discharged home. She is instructed to followup with her primary care physician in one week.   Medical Consults:  None  Other Consults:  Physical therapy: Skilled nursing home placement versus home with 24-hour assistance recommended.  Occupational therapy: Skilled nursing home placement; home health OT if discharged home.  Dietitian: Underweight. Encouraged ongoing by mouth intake.  Procedures:   12 Lead EKG 12/16/11: SINUS RHYTHM ~ normal P axis, V-rate 50- 99 RBBB AND LAFB ~ QRSd >126mS, axis(-40,240) PROBABLE LEFT VENTRICULAR HYPERTROPHY ~ (RaVL+SV3)xQRSd >300   Significant Diagnostic Studies:   Dg  Chest 2 View 12/16/2011  *RADIOLOGY REPORT*  Clinical Data: Weakness, altered mental status.  CHEST - 2 VIEW  Comparison: 10/27/2011  Findings: There is hyperinflation of the lungs compatible with COPD.  Small bilateral effusions.  No confluent opacity.  Stable chronic peribronchial thickening.  Tortuosity of the thoracic aorta.  No acute bony abnormality.  Degenerative changes in the thoracic spine and shoulders.  IMPRESSION: COPD.  Chronic bronchitic changes.  Small effusions.  Original Report Authenticated By: Cyndie Chime, M.D.    Discharge Laboratory Values: Basic Metabolic Panel:  Lab 12/18/11 4098 12/17/11 0410 12/16/11 1000  NA 140 139 137  K 4.4 3.3* --  CL 108 104 100  CO2 26 26 30   GLUCOSE 82 93 96  BUN 17 18 12   CREATININE 0.62 0.78 0.63  CALCIUM 8.9 9.1 9.7  MG -- -- --  PHOS -- -- --   GFR Estimated Creatinine Clearance: 32.8 ml/min (by C-G formula based on Cr of 0.62). Liver Function Tests:  Lab 12/16/11 1000  AST 17  ALT 7  ALKPHOS 84  BILITOT 0.3  PROT 7.0  ALBUMIN 3.3*    CBC:  Lab 12/18/11 0400 12/16/11 1000  WBC 3.4* 4.3  NEUTROABS -- 2.7  HGB 10.9* 12.9  HCT 33.5* 39.6  MCV 85.5 85.3  PLT 271 298   Cardiac Enzymes:  Lab 12/16/11 1000  CKTOTAL --  CKMB --  CKMBINDEX --  TROPONINI <0.30   Thyroid function studies  Basename 12/18/11 0400  TSH  3.177  T4TOTAL --  T3FREE --  THYROIDAB --   Microbiology Recent Results (from the past 240 hour(s))  URINE CULTURE     Status: Normal   Collection Time   12/16/11 10:14 AM      Component Value Range Status Comment   Specimen Description URINE, CATHETERIZED   Final    Special Requests NONE   Final    Culture  Setup Time [id number]   Final    Colony Count 60,000 COLONIES/ML   Final    Culture     Final    Value: Multiple bacterial morphotypes present, none predominant. Suggest appropriate recollection if clinically indicated.   Report Status 12/18/2011 FINAL   Final      Brief H and  P: For complete details please refer to admission H and P, but in brief, Tamara Miller is an 78 y.o. female with no significant PMH who was brought to the hospital by EMS with weakness on 12/16/11. She was felt to have toxic/metabolic encephalopathy from a UTI, hypoglycemia, and malignant hypertension.    Physical Exam at Discharge: BP 146/74  Pulse 85  Temp(Src) 97.9 F (36.6 C) (Oral)  Resp 16  Ht 5\' 5"  (1.651 m)  Wt 52.935 kg (116 lb 11.2 oz)  BMI 19.42 kg/m2  SpO2 95% Gen:  NAD Cardiovascular:  RRR, No M/R/G Respiratory: Lungs CTAB Gastrointestinal: Abdomen soft, NT/ND with normal active bowel sounds. Extremities: No C/E/C   Hospital Course:  *Toxic metabolic encephalopathy  The patient's altered mental status appeared to be from a combination of toxic and metabolic etiologies. She had a polymicrobial urinary tract infection. She also was hypoglycemic and had malignant hypertension upon initial presentation, all of which were likely contributory. Her mental status improved once she was given a dose of Rocephin, glucose gel, and labetalol to bring her blood pressure down in the ER. She had some delirium throughout her hospital stay, which is likely being exacerbated by the unfamiliar environment. She required a sitter for the first 24 hours of her hospital stay that the sitter was able to be discontinued yesterday due to to a clearing of her sensorium. At this point, the patient is back to baseline and is being discharged home. Active Problems:  Failure to thrive / Generalized weakness  Patient is of advanced age and appears to be failing to thrive at home. She has had multiple hospitalizations for recurrent urinary tract infection. We have obtain physical and occupational therapy evaluations for further recommendations, with skilled nursing home placement versus home with 24-hour supervision recommended. The patient's family prefers to take her home and we will set her up with home  health nursing, an aide, physical and occupational therapy as well as a Child psychotherapist to help the family with any further needs that they may have. Pain in right hip  This has been imaged in the past and appears to be secondary to arthritis. She was treated with Motrin on an as-needed basis and Vicodin for breakthrough pain.  UTI (lower urinary tract infection)  Urinalysis did reveal some white blood cells and bacteria in the urine. Cultures were sent, and grew multiple bacterial morphotypes.  She was treated empirically with Rocephin and we will switch her over to by mouth Ceftin at discharge for an additional 4 days of therapy. Protein calorie malnutrition / underweight / BMI 21  Patient appears to be underweight and has been losing weight according to the patient's daughter. She was seen by the dietitian on 12/17/2011  with recommendations for ongoing encouragement of by mouth intake. Thyroid function was normal.  Intolerant of cold  Thyroid function was normal.  Malignant hypertension  Patient was given a dose of labetalol in the ER with a reduction in blood pressure. We started her on 5 mg of Norvasc daily and used labetalol when necessary for systolic blood pressure greater than 160 or diastolic blood pressure greater than 95. BP improved on Norvasc, but was not optimally controlled at this dose. We increased Norvasc to 10 mg daily on 12/18/2011, with improved blood pressure control. We will discharge her on the 10 mg dose of Norvasc and recommend close followup for a repeat blood pressure check to ensure that her blood pressure remains well-controlled.  Hypoglycemia  Patient has no known history of diabetes and is not on any medications that would drop her blood glucose. May simply be due to poor by mouth intake. She had no further episodes of fasting hypoglycemia while in the hospital. Right bundle branch block  No old EKG's for comparison, but the patient is not having any complaints of chest  pain or symptoms suggestive of ACS. Troponin negative on admission.  Hypokalemia  Repleted in IVF.  Normocytic anemia  Patient was not anemic on initial presentation and the drop in her hemoglobin is likely due to dilutional factors. Her anemia is likely from chronic disease and bone marrow fibrosis given her advanced age.   Recommendations for hospital follow-up: 1.  Repeat blood pressure check in one week to ensure that her blood pressure remains well controlled on Norvasc.  Diet:  Regular  Activity:  Increase activity slowly  Condition at Discharge:   Improved  Time spent on Discharge:  35 minutes  Signed: Dr. Trula Ore Candice Lunney Pager [phone] 12/19/2011, 7:19 AM

## 2011-12-19 NOTE — Discharge Instructions (Signed)

## 2012-04-17 ENCOUNTER — Other Ambulatory Visit: Payer: Self-pay | Admitting: Internal Medicine

## 2012-11-29 ENCOUNTER — Emergency Department (HOSPITAL_COMMUNITY): Payer: Medicare Other

## 2012-11-29 ENCOUNTER — Encounter (HOSPITAL_COMMUNITY): Payer: Self-pay | Admitting: Emergency Medicine

## 2012-11-29 ENCOUNTER — Emergency Department (HOSPITAL_COMMUNITY)
Admission: EM | Admit: 2012-11-29 | Discharge: 2012-11-29 | Disposition: A | Discharge status: Home / Self Care | Payer: Medicare Other | Attending: Emergency Medicine | Admitting: Emergency Medicine

## 2012-11-29 DIAGNOSIS — Z8739 Personal history of other diseases of the musculoskeletal system and connective tissue: Secondary | ICD-10-CM | POA: Insufficient documentation

## 2012-11-29 DIAGNOSIS — K5900 Constipation, unspecified: Secondary | ICD-10-CM | POA: Insufficient documentation

## 2012-11-29 DIAGNOSIS — I10 Essential (primary) hypertension: Secondary | ICD-10-CM | POA: Insufficient documentation

## 2012-11-29 DIAGNOSIS — Z79899 Other long term (current) drug therapy: Secondary | ICD-10-CM | POA: Insufficient documentation

## 2012-11-29 DIAGNOSIS — N201 Calculus of ureter: Secondary | ICD-10-CM | POA: Insufficient documentation

## 2012-11-29 DIAGNOSIS — R509 Fever, unspecified: Secondary | ICD-10-CM | POA: Insufficient documentation

## 2012-11-29 DIAGNOSIS — R748 Abnormal levels of other serum enzymes: Secondary | ICD-10-CM

## 2012-11-29 LAB — POCT I-STAT, CHEM 8
Chloride: 109 mEq/L (ref 96–112)
HCT: 29 % — ABNORMAL LOW (ref 36.0–46.0)
Hemoglobin: 9.9 g/dL — ABNORMAL LOW (ref 12.0–15.0)
Potassium: 3.3 mEq/L — ABNORMAL LOW (ref 3.5–5.1)
Sodium: 144 mEq/L (ref 135–145)

## 2012-11-29 LAB — CBC WITH DIFFERENTIAL/PLATELET
Basophils Absolute: 0 10*3/uL (ref 0.0–0.1)
Eosinophils Absolute: 0 10*3/uL (ref 0.0–0.7)
Eosinophils Relative: 0 % (ref 0–5)
HCT: 29.3 % — ABNORMAL LOW (ref 36.0–46.0)
Lymphocytes Relative: 5 % — ABNORMAL LOW (ref 12–46)
MCH: 28.6 pg (ref 26.0–34.0)
MCV: 85.4 fL (ref 78.0–100.0)
Monocytes Absolute: 0.5 10*3/uL (ref 0.1–1.0)
Platelets: 217 10*3/uL (ref 150–400)
RDW: 14.2 % (ref 11.5–15.5)
WBC: 9.6 10*3/uL (ref 4.0–10.5)

## 2012-11-29 LAB — COMPREHENSIVE METABOLIC PANEL
CO2: 25 mEq/L (ref 19–32)
Calcium: 8.6 mg/dL (ref 8.4–10.5)
Creatinine, Ser: 0.74 mg/dL (ref 0.50–1.10)
GFR calc Af Amer: 78 mL/min — ABNORMAL LOW (ref >90)
GFR calc non Af Amer: 68 mL/min — ABNORMAL LOW (ref >90)
Glucose, Bld: 153 mg/dL — ABNORMAL HIGH (ref 70–99)
Total Protein: 6.5 g/dL (ref 6.0–8.3)

## 2012-11-29 LAB — URINALYSIS, ROUTINE W REFLEX MICROSCOPIC
Glucose, UA: NEGATIVE mg/dL
Hgb urine dipstick: NEGATIVE
Protein, ur: 30 mg/dL — AB
Urobilinogen, UA: 4 mg/dL — ABNORMAL HIGH (ref 0.0–1.0)

## 2012-11-29 LAB — URINE MICROSCOPIC-ADD ON

## 2012-11-29 MED ORDER — IOHEXOL 300 MG/ML  SOLN
100.0000 mL | Freq: Once | INTRAMUSCULAR | Status: AC | PRN
  Administered 2012-11-29: 100 mL via INTRAVENOUS

## 2012-11-29 MED ORDER — IOHEXOL 300 MG/ML  SOLN
50.0000 mL | Freq: Once | INTRAMUSCULAR | Status: AC | PRN
  Administered 2012-11-29: 50 mL via ORAL

## 2012-11-29 MED ORDER — POTASSIUM CHLORIDE CRYS ER 20 MEQ PO TBCR
20.0000 meq | EXTENDED_RELEASE_TABLET | Freq: Once | ORAL | Status: AC
  Administered 2012-11-29: 20 meq via ORAL
  Filled 2012-11-29: qty 1

## 2012-11-29 MED ORDER — SODIUM CHLORIDE 0.9 % IV SOLN
INTRAVENOUS | Status: DC
  Administered 2012-11-29: 02:00:00 via INTRAVENOUS

## 2012-11-29 NOTE — ED Notes (Signed)
Patient transported to CT 

## 2012-11-29 NOTE — ED Notes (Signed)
As per EMS pt c/o abd pain for several days, pt family sts pt temp 100 temporal. Pt has Hx SBO, pt abd is tender with diarrhea.lungs are clear with shallow breaths.

## 2012-11-29 NOTE — ED Provider Notes (Signed)
History     CSN: [id number]  Arrival date & time 11/29/12  0101   First MD Initiated Contact with Patient 11/29/12 0120      Chief Complaint  Patient presents with  . Abdominal Pain    (Consider location/radiation/quality/duration/timing/severity/associated sxs/prior treatment) HPI Hx per PT and family bedside. Fever tonight with R sided ABd pain.  Was sharp and severe and now is improving - PT declines any pain medication at this time.  She has recently been constipated and started on laxatives that are working and has been having multiple non-bloody bowel move,ents over the last week.  No N/V. No rash. No trauma or falls. Symptoms moderate in severity. feels better with rest  Past Medical History  Diagnosis Date  . Arthritis   . HTN (hypertension)     Taken off BP meds by PCP when BP normalized    Past Surgical History  Procedure Laterality Date  . Abdominal hysterectomy      Family History  Problem Relation Age of Onset  . Heart disease Father   . Cancer Brother   . Cancer Brother   . Dementia Son     History  Substance Use Topics  . Smoking status: Never Smoker   . Smokeless tobacco: Never Used  . Alcohol Use: No    OB History   Grav Para Term Preterm Abortions TAB SAB Ect Mult Living                  Review of Systems  Constitutional: Positive for fever. Negative for chills.  HENT: Negative for neck pain and neck stiffness.   Eyes: Negative for visual disturbance.  Respiratory: Negative for shortness of breath.   Cardiovascular: Negative for chest pain.  Gastrointestinal: Positive for abdominal pain.  Genitourinary: Negative for dysuria.  Musculoskeletal: Negative for back pain.  Skin: Negative for rash.  Neurological: Negative for headaches.  All other systems reviewed and are negative.    Allergies  Review of patient's allergies indicates no known allergies.  Home Medications   Current Outpatient Rx  Name  Route  Sig  Dispense  Refill  .  acetaminophen (TYLENOL) 500 MG tablet   Oral   Take 500 mg by mouth every 6 (six) hours as needed for pain.         Marland Kitchen amLODipine (NORVASC) 10 MG tablet   Oral   Take 1 tablet (10 mg total) by mouth daily.   30 tablet   3   . diphenhydrAMINE (BENADRYL) 25 mg capsule   Oral   Take 25 mg by mouth every 6 (six) hours as needed for itching or allergies.         Marland Kitchen HYDROcodone-acetaminophen (NORCO/VICODIN) 5-325 MG per tablet   Oral   Take 0.5-1 tablets by mouth every 8 (eight) hours as needed for pain.         . meclizine (ANTIVERT) 12.5 MG tablet   Oral   Take 12.5 mg by mouth 3 (three) times daily as needed for dizziness.         . nitrofurantoin, macrocrystal-monohydrate, (MACROBID) 100 MG capsule   Oral   Take 100 mg by mouth 2 (two) times daily.           BP 94/71  Pulse 77  Temp(Src) 99.4 F (37.4 C) (Rectal)  Resp 18  SpO2 98%  Physical Exam  Constitutional: She appears well-developed and well-nourished.  HENT:  Head: Normocephalic and atraumatic.  Mouth/Throat: Oropharynx is clear and moist. No  oropharyngeal exudate.  Eyes: Conjunctivae and EOM are normal. Pupils are equal, round, and reactive to light. No scleral icterus.  Neck: Normal range of motion. Neck supple.  Cardiovascular: Normal rate, regular rhythm and intact distal pulses.   Pulmonary/Chest: Effort normal and breath sounds normal. No respiratory distress. She has no wheezes. She has no rales. She exhibits no tenderness.  Abdominal: Soft. Bowel sounds are normal. She exhibits no distension. There is no rebound and no guarding.  RLQ tenderness  Musculoskeletal: Normal range of motion. She exhibits no edema.  Neurological:  Awake, alert, interactive, no focal deficits  Skin: Skin is warm and dry. No rash noted.    ED Course  Procedures (including critical care time)  Results for orders placed during the hospital encounter of 11/29/12  URINALYSIS, ROUTINE W REFLEX MICROSCOPIC      Result  Value Range   Color, Urine AMBER (*) YELLOW   APPearance CLOUDY (*) CLEAR   Specific Gravity, Urine 1.017  1.005 - 1.030   pH 5.5  5.0 - 8.0   Glucose, UA NEGATIVE  NEGATIVE mg/dL   Hgb urine dipstick NEGATIVE  NEGATIVE   Bilirubin Urine SMALL (*) NEGATIVE   Ketones, ur TRACE (*) NEGATIVE mg/dL   Protein, ur 30 (*) NEGATIVE mg/dL   Urobilinogen, UA 4.0 (*) 0.0 - 1.0 mg/dL   Nitrite NEGATIVE  NEGATIVE   Leukocytes, UA SMALL (*) NEGATIVE  CBC WITH DIFFERENTIAL      Result Value Range   WBC 9.6  4.0 - 10.5 K/uL   RBC 3.43 (*) 3.87 - 5.11 MIL/uL   Hemoglobin 9.8 (*) 12.0 - 15.0 g/dL   HCT 45.4 (*) 09.8 - 11.9 %   MCV 85.4  78.0 - 100.0 fL   MCH 28.6  26.0 - 34.0 pg   MCHC 33.4  30.0 - 36.0 g/dL   RDW 14.7  82.9 - 56.2 %   Platelets 217  150 - 400 K/uL   Neutrophils Relative 90 (*) 43 - 77 %   Neutro Abs 8.6 (*) 1.7 - 7.7 K/uL   Lymphocytes Relative 5 (*) 12 - 46 %   Lymphs Abs 0.5 (*) 0.7 - 4.0 K/uL   Monocytes Relative 5  3 - 12 %   Monocytes Absolute 0.5  0.1 - 1.0 K/uL   Eosinophils Relative 0  0 - 5 %   Eosinophils Absolute 0.0  0.0 - 0.7 K/uL   Basophils Relative 0  0 - 1 %   Basophils Absolute 0.0  0.0 - 0.1 K/uL  COMPREHENSIVE METABOLIC PANEL      Result Value Range   Sodium 141  135 - 145 mEq/L   Potassium 3.2 (*) 3.5 - 5.1 mEq/L   Chloride 105  96 - 112 mEq/L   CO2 25  19 - 32 mEq/L   Glucose, Bld 153 (*) 70 - 99 mg/dL   BUN 20  6 - 23 mg/dL   Creatinine, Ser 1.30  0.50 - 1.10 mg/dL   Calcium 8.6  8.4 - 86.5 mg/dL   Total Protein 6.5  6.0 - 8.3 g/dL   Albumin 2.6 (*) 3.5 - 5.2 g/dL   AST 784 (*) 0 - 37 U/L   ALT 202 (*) 0 - 35 U/L   Alkaline Phosphatase 285 (*) 39 - 117 U/L   Total Bilirubin 0.4  0.3 - 1.2 mg/dL   GFR calc non Af Amer 68 (*) >90 mL/min   GFR calc Af Amer 78 (*) >90 mL/min  LIPASE, BLOOD      Result Value Range   Lipase 14  11 - 59 U/L  URINE MICROSCOPIC-ADD ON      Result Value Range   Squamous Epithelial / LPF RARE  RARE   WBC, UA 3-6   <3 WBC/hpf   RBC / HPF 0-2  <3 RBC/hpf   Bacteria, UA RARE  RARE   Urine-Other MUCOUS PRESENT    POCT I-STAT, CHEM 8      Result Value Range   Sodium 144  135 - 145 mEq/L   Potassium 3.3 (*) 3.5 - 5.1 mEq/L   Chloride 109  96 - 112 mEq/L   BUN 20  6 - 23 mg/dL   Creatinine, Ser 1.61  0.50 - 1.10 mg/dL   Glucose, Bld 096 (*) 70 - 99 mg/dL   Calcium, Ion 0.45  4.09 - 1.30 mmol/L   TCO2 28  0 - 100 mmol/L   Hemoglobin 9.9 (*) 12.0 - 15.0 g/dL   HCT 81.1 (*) 91.4 - 78.2 %   Ct Abdomen Pelvis W Contrast  11/29/2012  *RADIOLOGY REPORT*  Clinical Data: Right-sided abdominal pain and fever for several days.  White cell count 9.6.  CT ABDOMEN AND PELVIS WITH CONTRAST  Technique:  Multidetector CT imaging of the abdomen and pelvis was performed following the standard protocol during bolus administration of intravenous contrast.  Contrast: OMNIPAQUE IOHEXOL 300 MG/ML  SOLN  Comparison: 01/26/2009  Findings: Small bilateral pleural effusions with infiltration or atelectasis in both lung bases.  The liver, spleen, gallbladder, pancreas, adrenal glands, and retroperitoneal lymph nodes are unremarkable.  Calcification of the abdominal aorta without aneurysm.  Sub centimeter fat containing lesion in the upper pole of the right kidney consistent with a myelolipoma.  Mild pyelocaliectasis of the right kidney with a 2 mm stone in the proximal right ureter at the level of L4.  The stomach is decompressed.  Small bowel are not significantly dilated.  Stool filled colon without distension.  No free air or free fluid in the abdomen.  There is edema in the subcutaneous fat and mesentery.  Pelvis:  The uterus appears to be surgically absent.  No abnormal adnexal masses.  The bladder wall is not thickened.  No free or loculated pelvic fluid collections.  Diverticula in the sigmoid colon without diverticulitis.  The appendix is normal.  No significant pelvic lymphadenopathy.  Diffuse bone demineralization with  degenerative changes throughout the lumbar spine. Degenerative changes in the hips.  Zones of relative sclerosis in the vertebrae may be due to demineralization versus metabolic bone disease.  No focal bone lesions are appreciated.  IMPRESSION: Small bilateral pleural effusions with infiltration or atelectasis in the lung bases.  2 mm stone in the right ureter with proximal pyelocaliectasis and ureterectasis suggesting mild to moderate obstruction.  Diffuse bone demineralization suggesting osteoporosis.  No acute process otherwise demonstrated in the abdomen or pelvis.   Original Report Authenticated By: Burman Nieves, M.D.    No sig pain or fever in the ED, resting comfortably on serial exams.   Family very comfortable with plan d/c home and PT prefers to be discharge. She has hydrocodone at home to take as needed and agrees to continue for any recurrent R sided pain. Plan outpatient follow up elevated LFTs. Urology referral as needed.    MDM  R sided pain in a 79 y/o female who lives at home with family, reported fever today. Now afebrile with labs and imaging reviewed as above.  Potassium for hypokalemia. No pain meds required in the ED. VS and nursing notes reviewed.         Sunnie Nielsen, MD 11/29/12 [phone]

## 2012-11-29 NOTE — ED Notes (Signed)
OZH:[id number]<HQ> Expected date:11/29/12<BR> Expected time:12:38 AM<BR> Means of arrival:Ambulance<BR> Comments:<BR> 79 yo F  abd pain

## 2012-11-29 NOTE — ED Notes (Signed)
CT notified pt completed CM 

## 2012-11-29 NOTE — ED Notes (Addendum)
Patient transported to [facility] 

## 2013-03-21 IMAGING — CT CT HIP*R* W/O CM
2 of 5 series · 15 of 46 positions shown, 17 images · non-contrast
Comparison: Plain films the right hip 05/30/2009 and CT abdomen and
pelvis 01/26/2009.

CLINICAL DATA: Status post fall 1 week ago.  Right hip pain.

CT OF THE RIGHT HIP WITHOUT CONTRAST
TECHNIQUE: Multidetector CT imaging was performed according to the
standard protocol. Multiplanar CT image reconstructions were also
generated.

[Series 3: hip 3.0 b31f st · axial · 0.75mm/px · z∈[-315,-153]mm · 12 of 64 slices shown, 14 images]
[im 5/64  soft-tissue]
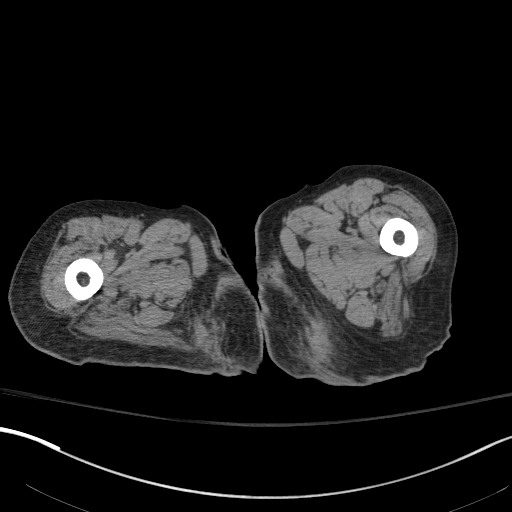
[im 5/64  bone]
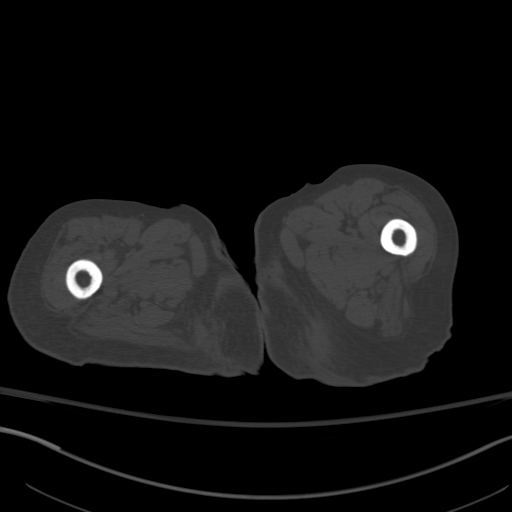
[im 9/64  soft-tissue]
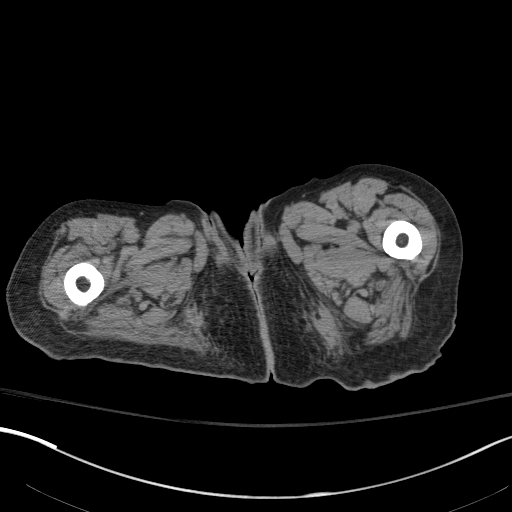
[im 15/64  soft-tissue]
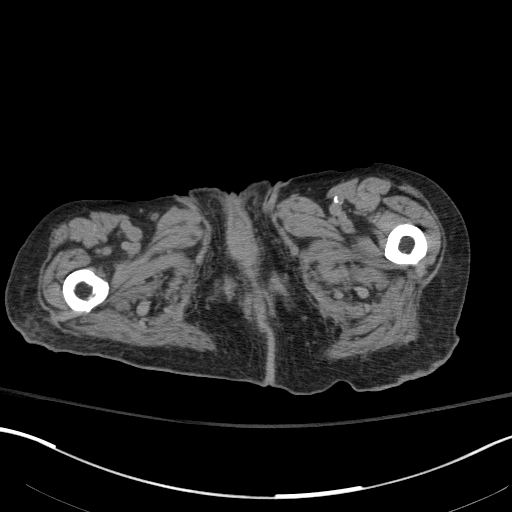
[im 19/64  soft-tissue]
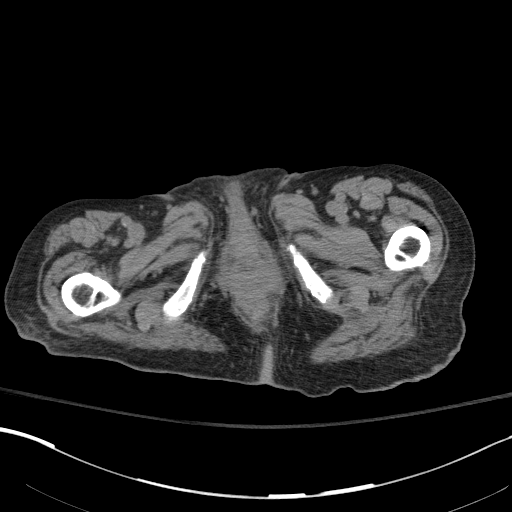
[im 25/64  soft-tissue]
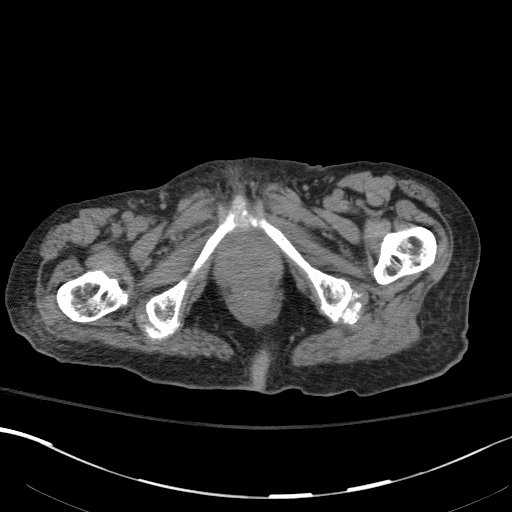
[im 29/64  soft-tissue]
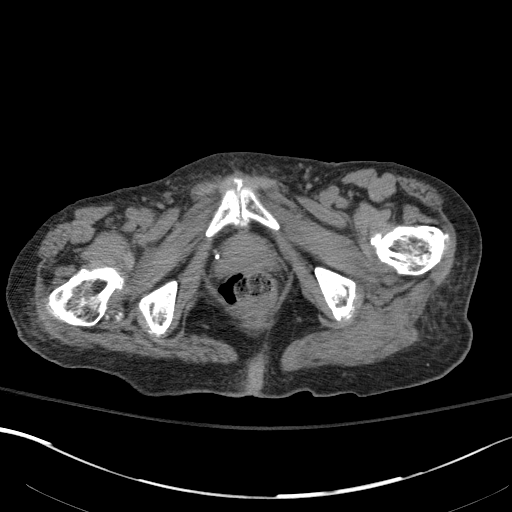
[im 35/64  soft-tissue]
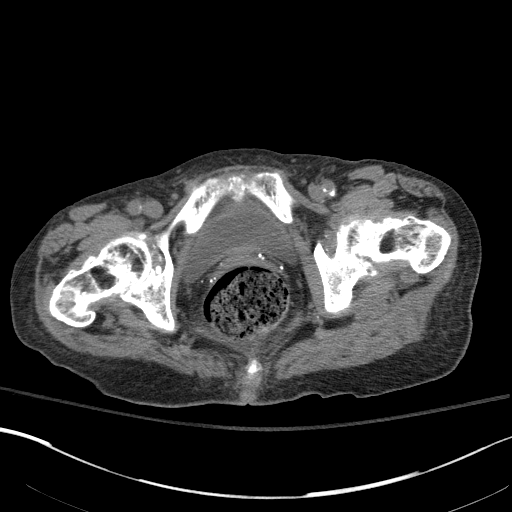
[im 39/64  soft-tissue]
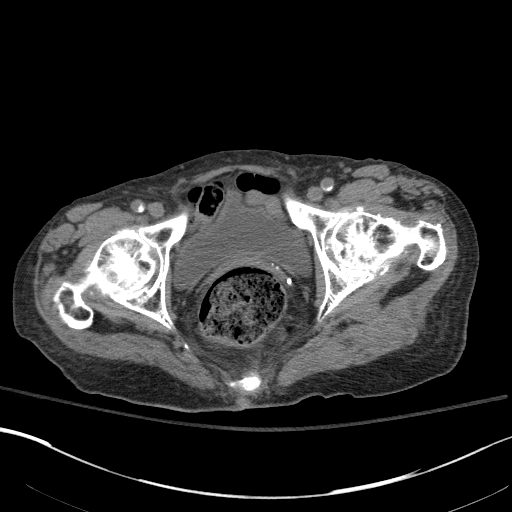
[im 45/64  soft-tissue]
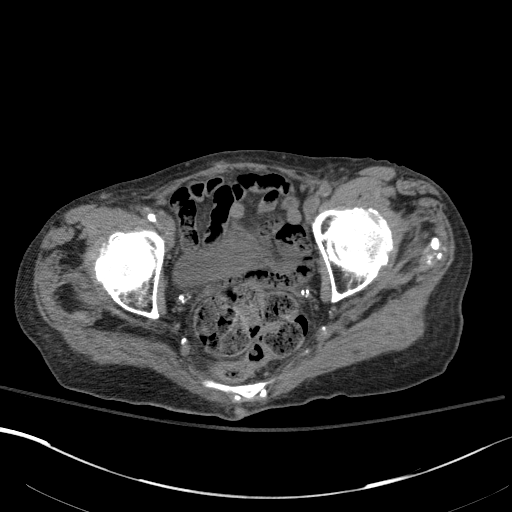
[im 45/64  bone]
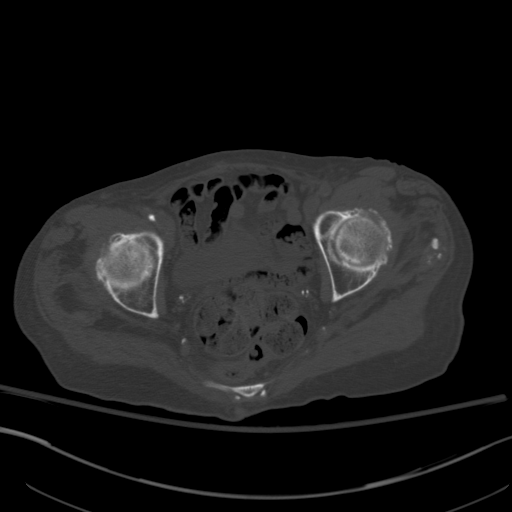
[im 49/64  soft-tissue]
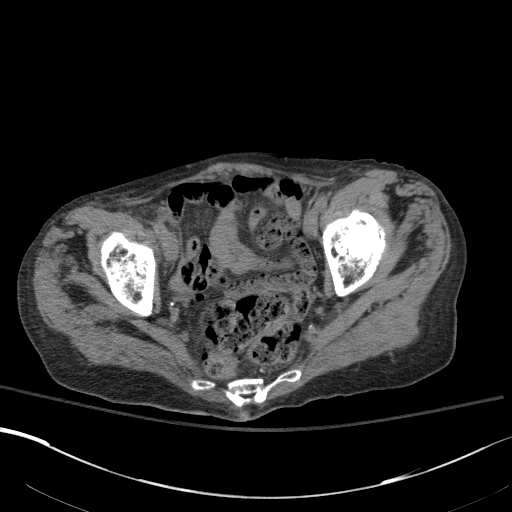
[im 55/64  soft-tissue]
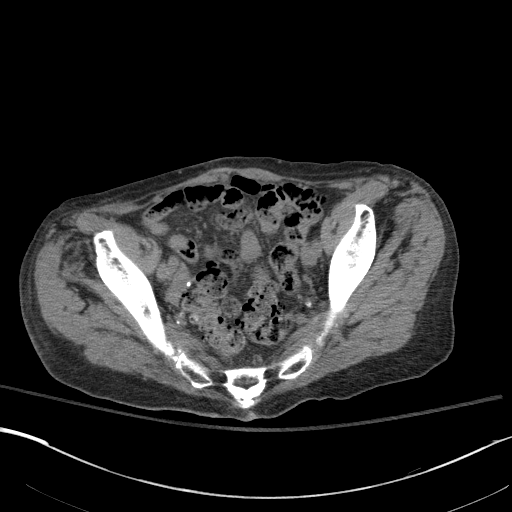
[im 59/64  soft-tissue]
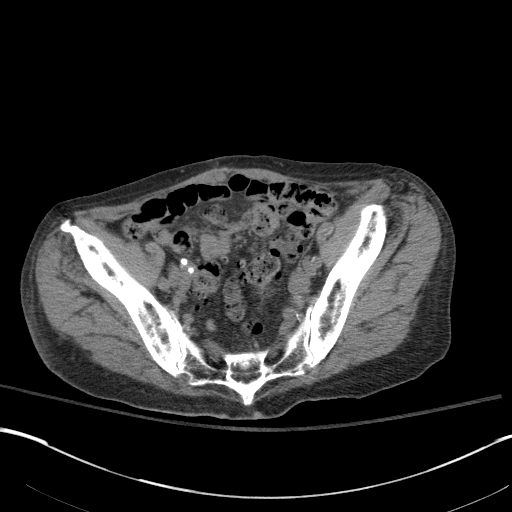

[Series 4: hip 2.0 coronal · coronal · 0.40mm/px · 3 of 100 slices shown]
[im 25/100  soft-tissue]
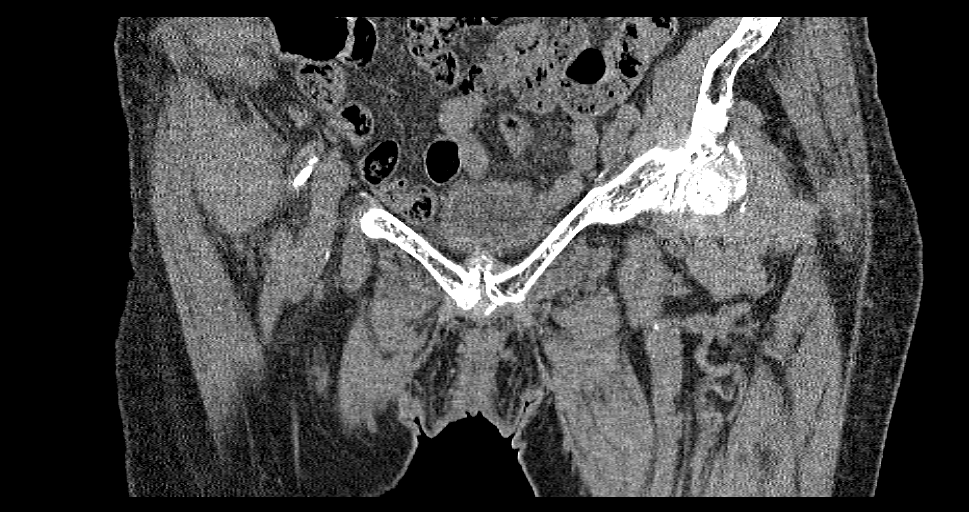
[im 50/100  soft-tissue]
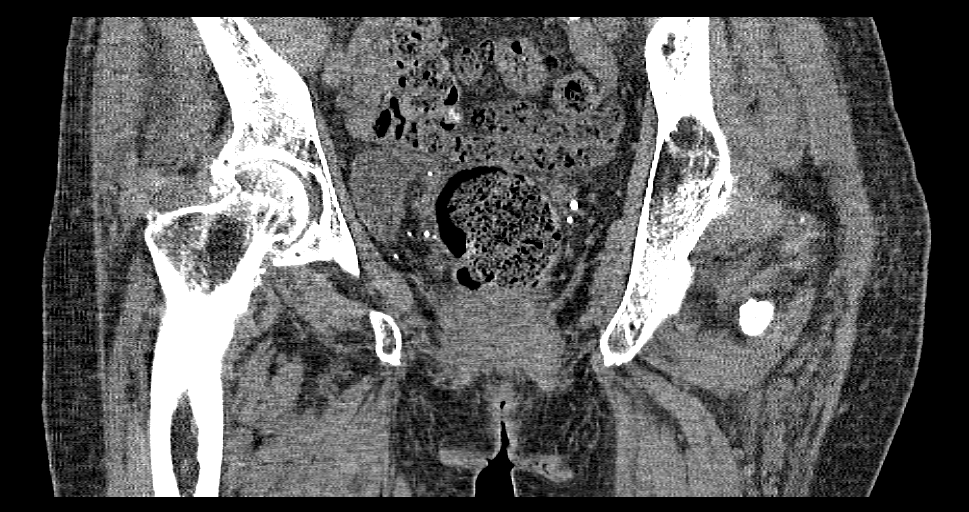
[im 75/100  soft-tissue]
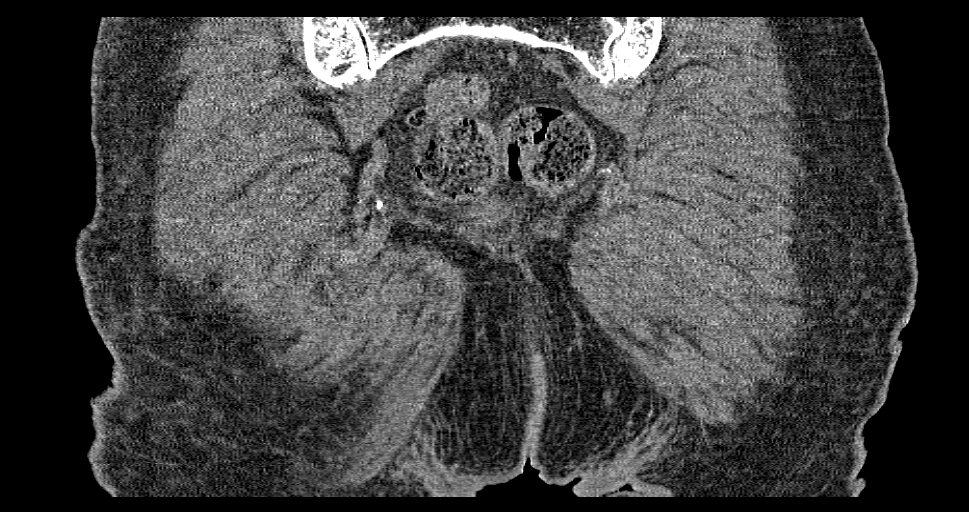

[15 of 46 positions shown; findings below may reference images not displayed]

FINDINGS: Both femoral heads are located.  There is advanced
bilateral hip degenerative change.  No fracture is identified.
Loose bodies are seen in the right hip joint, unchanged.  No joint
effusion is identified. Imaged intra-pelvic contents show no focal
abnormality.
IMPRESSION: 1.  Negative for fracture.
2.  Advanced bilateral hip degenerative disease.

## 2013-03-21 IMAGING — CR DG CHEST 1V
1 series · 1 of 1 positions shown · non-contrast
Comparison: PA and lateral chest 09/19/2010.

CLINICAL DATA: Preoperative films.  Hip fracture.

CHEST - 1 VIEW

[view not recorded]
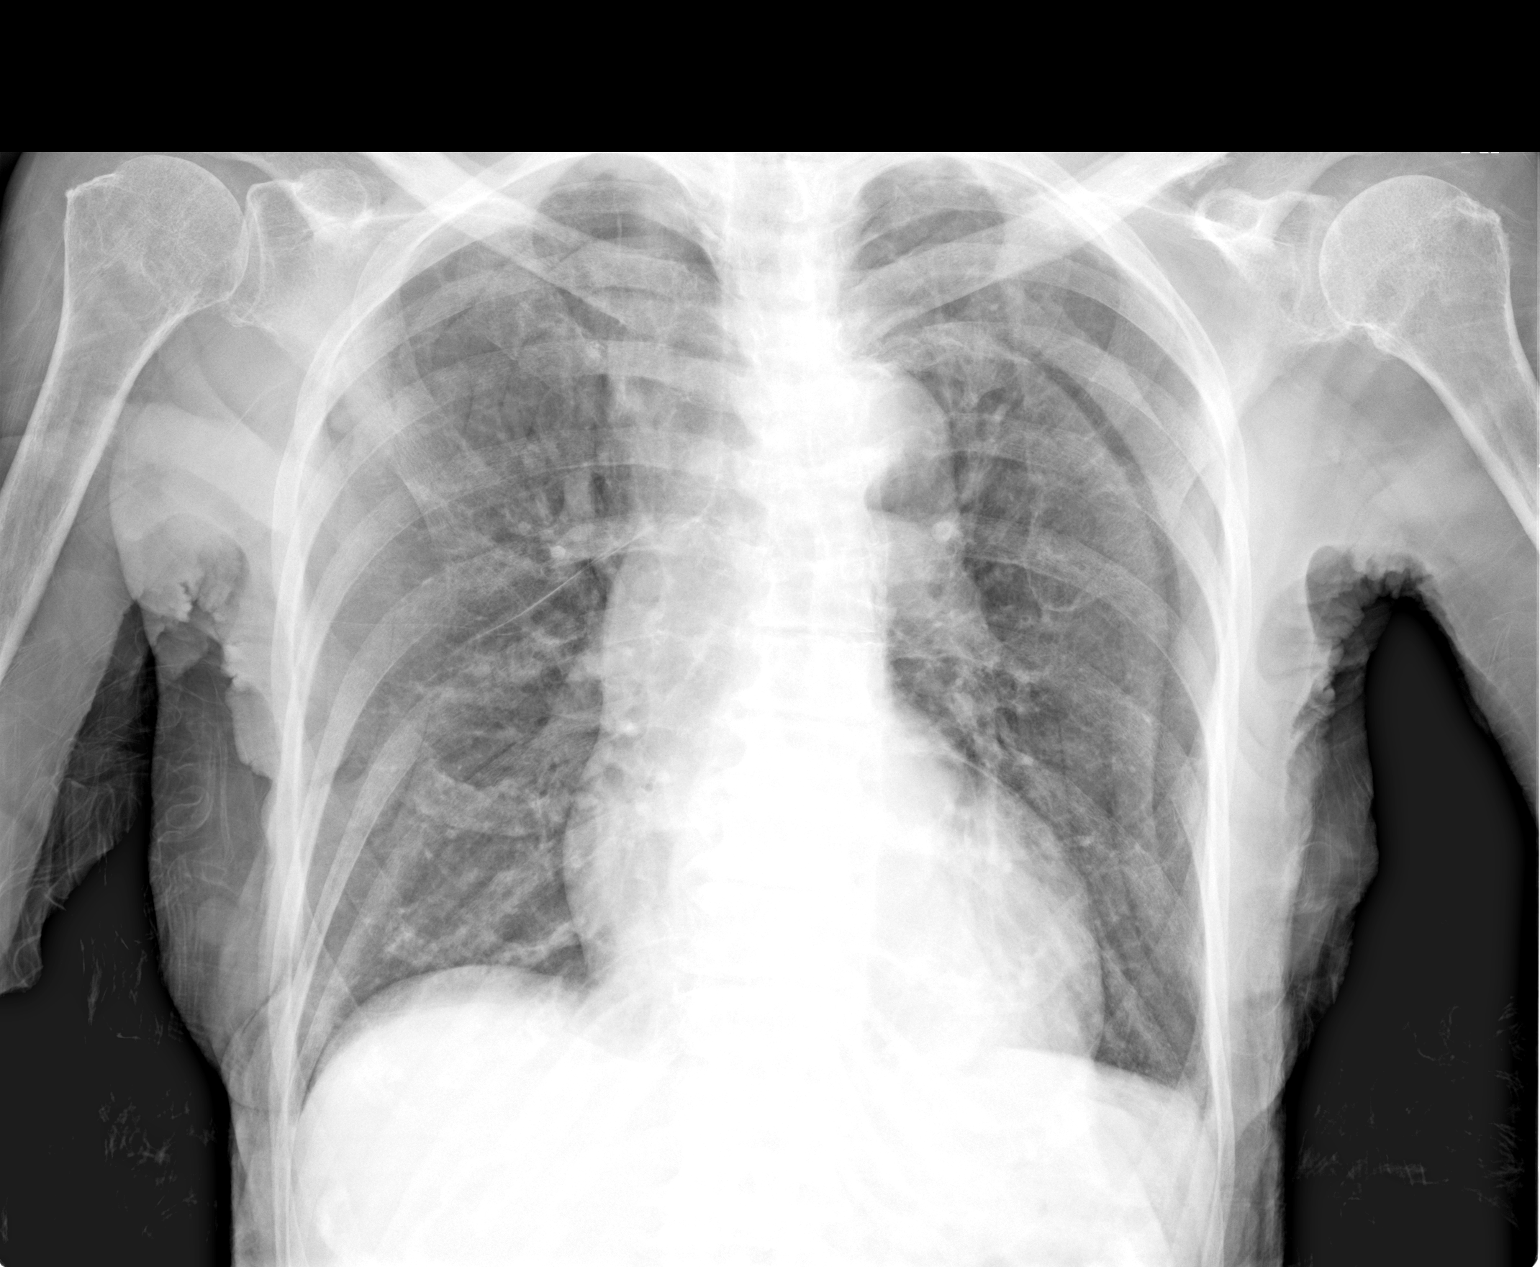

[1 of 1 positions shown; findings below may reference images not displayed]

FINDINGS: Heart size is upper normal with some vascular congestion.
No consolidative process, pneumothorax or effusion.  No focal bony
abnormality.
IMPRESSION: No acute finding.

## 2013-09-17 ENCOUNTER — Inpatient Hospital Stay (HOSPITAL_COMMUNITY)
Admission: EM | Admit: 2013-09-17 | Discharge: 2013-09-21 | DRG: 065 | Disposition: A | Discharge status: Skilled Nursing Facility | Payer: Medicare Other | Attending: Internal Medicine | Admitting: Internal Medicine

## 2013-09-17 ENCOUNTER — Emergency Department (HOSPITAL_COMMUNITY): Payer: Medicare Other

## 2013-09-17 ENCOUNTER — Encounter (HOSPITAL_COMMUNITY): Payer: Self-pay | Admitting: Emergency Medicine

## 2013-09-17 DIAGNOSIS — M25551 Pain in right hip: Secondary | ICD-10-CM

## 2013-09-17 DIAGNOSIS — R531 Weakness: Secondary | ICD-10-CM

## 2013-09-17 DIAGNOSIS — I6350 Cerebral infarction due to unspecified occlusion or stenosis of unspecified cerebral artery: Principal | ICD-10-CM | POA: Diagnosis present

## 2013-09-17 DIAGNOSIS — M6281 Muscle weakness (generalized): Secondary | ICD-10-CM

## 2013-09-17 DIAGNOSIS — E46 Unspecified protein-calorie malnutrition: Secondary | ICD-10-CM

## 2013-09-17 DIAGNOSIS — F0390 Unspecified dementia without behavioral disturbance: Secondary | ICD-10-CM | POA: Diagnosis present

## 2013-09-17 DIAGNOSIS — R636 Underweight: Secondary | ICD-10-CM

## 2013-09-17 DIAGNOSIS — G8190 Hemiplegia, unspecified affecting unspecified side: Secondary | ICD-10-CM | POA: Diagnosis present

## 2013-09-17 DIAGNOSIS — I10 Essential (primary) hypertension: Secondary | ICD-10-CM | POA: Diagnosis present

## 2013-09-17 DIAGNOSIS — M1990 Unspecified osteoarthritis, unspecified site: Secondary | ICD-10-CM

## 2013-09-17 DIAGNOSIS — E785 Hyperlipidemia, unspecified: Secondary | ICD-10-CM | POA: Diagnosis present

## 2013-09-17 DIAGNOSIS — R29898 Other symptoms and signs involving the musculoskeletal system: Secondary | ICD-10-CM | POA: Diagnosis present

## 2013-09-17 DIAGNOSIS — E162 Hypoglycemia, unspecified: Secondary | ICD-10-CM

## 2013-09-17 DIAGNOSIS — E876 Hypokalemia: Secondary | ICD-10-CM

## 2013-09-17 DIAGNOSIS — R6889 Other general symptoms and signs: Secondary | ICD-10-CM

## 2013-09-17 DIAGNOSIS — M129 Arthropathy, unspecified: Secondary | ICD-10-CM | POA: Diagnosis present

## 2013-09-17 DIAGNOSIS — G459 Transient cerebral ischemic attack, unspecified: Secondary | ICD-10-CM | POA: Diagnosis present

## 2013-09-17 DIAGNOSIS — I639 Cerebral infarction, unspecified: Secondary | ICD-10-CM | POA: Diagnosis present

## 2013-09-17 DIAGNOSIS — N390 Urinary tract infection, site not specified: Secondary | ICD-10-CM

## 2013-09-17 DIAGNOSIS — R6251 Failure to thrive (child): Secondary | ICD-10-CM

## 2013-09-17 DIAGNOSIS — I4510 Unspecified right bundle-branch block: Secondary | ICD-10-CM

## 2013-09-17 DIAGNOSIS — D649 Anemia, unspecified: Secondary | ICD-10-CM | POA: Diagnosis present

## 2013-09-17 LAB — CBC
HCT: 34.7 % — ABNORMAL LOW (ref 36.0–46.0)
Hemoglobin: 11.4 g/dL — ABNORMAL LOW (ref 12.0–15.0)
MCH: 28.5 pg (ref 26.0–34.0)
MCHC: 32.9 g/dL (ref 30.0–36.0)
WBC: 4.4 10*3/uL (ref 4.0–10.5)

## 2013-09-17 LAB — DIFFERENTIAL
Basophils Relative: 1 % (ref 0–1)
Eosinophils Absolute: 0.1 10*3/uL (ref 0.0–0.7)
Eosinophils Relative: 2 % (ref 0–5)
Lymphocytes Relative: 26 % (ref 12–46)
Monocytes Absolute: 0.4 10*3/uL (ref 0.1–1.0)
Monocytes Relative: 9 % (ref 3–12)
Neutrophils Relative %: 64 % (ref 43–77)

## 2013-09-17 LAB — POCT I-STAT, CHEM 8
BUN: 23 mg/dL (ref 6–23)
Calcium, Ion: 1.2 mmol/L (ref 1.13–1.30)
Chloride: 105 mEq/L (ref 96–112)
Creatinine, Ser: 0.9 mg/dL (ref 0.50–1.10)
HCT: 37 % (ref 36.0–46.0)
Potassium: 3.5 mEq/L (ref 3.5–5.1)

## 2013-09-17 LAB — COMPREHENSIVE METABOLIC PANEL
Albumin: 3.4 g/dL — ABNORMAL LOW (ref 3.5–5.2)
BUN: 24 mg/dL — ABNORMAL HIGH (ref 6–23)
Calcium: 9.1 mg/dL (ref 8.4–10.5)
Creatinine, Ser: 0.77 mg/dL (ref 0.50–1.10)
Total Bilirubin: 0.2 mg/dL — ABNORMAL LOW (ref 0.3–1.2)
Total Protein: 6.8 g/dL (ref 6.0–8.3)

## 2013-09-17 LAB — PROTIME-INR: INR: 0.96 (ref 0.00–1.49)

## 2013-09-17 LAB — GLUCOSE, CAPILLARY: Glucose-Capillary: 94 mg/dL (ref 70–99)

## 2013-09-17 LAB — POCT I-STAT TROPONIN I: Troponin i, poc: 0.03 ng/mL (ref 0.00–0.08)

## 2013-09-17 LAB — TROPONIN I: Troponin I: <0.3 ng/mL (ref <0.30)

## 2013-09-17 MED ORDER — HEPARIN SODIUM (PORCINE) 5000 UNIT/ML IJ SOLN
5000.0000 [IU] | Freq: Three times a day (TID) | INTRAMUSCULAR | Status: DC
  Administered 2013-09-18 – 2013-09-21 (×12): 5000 [IU] via SUBCUTANEOUS
  Filled 2013-09-17 (×13): qty 1

## 2013-09-17 MED ORDER — ASPIRIN EC 81 MG PO TBEC: 81.0000 mg | DELAYED_RELEASE_TABLET | Freq: Every day | ORAL | Status: DC

## 2013-09-17 MED ORDER — AMLODIPINE BESYLATE 10 MG PO TABS
10.0000 mg | ORAL_TABLET | Freq: Every day | ORAL | Status: DC
  Administered 2013-09-18 – 2013-09-21 (×4): 10 mg via ORAL
  Filled 2013-09-17 (×4): qty 1

## 2013-09-17 NOTE — H&P (Signed)
[facility] History and Physical  Patient: Tamara Miller  UJW:[id number]  DOB: 1909/10/24  DOS: the patient was seen and examined on 09/17/2013 PCP: [name], MD  Chief Complaint: Right-sided weakness and difficulty speaking  HPI: Tamara Miller is a 79 y.o. female with Past medical history of arthritis hypertension. The patient is coming from home. The patient was brought in by her daughter. She mentions that since yesterday at 5 PM she has been noticing speech difficulty in the patient. She also noted is that the patient was having weakness on her right side with movement. Her symptoms improved overnight and therefore she did not to go to the ER. When she woke up this morning she started having similar symptoms again and therefore she took her to the [facility] where she underwent a CT scan of the head which was not showing any acute abnormality and she was told that she has TIA and evening she has any stroke she would not have any significant workup that due to her age and was given option of observation in the hospital versus taking the patient home. The patient was taken to home since her symptoms are resolved. She had another episode in the home and therefore she was brought to the hospital in the afternoon. At present she denies any complaints of right-sided weakness but she continues to have speech difficulty as per the daughter. The daughter denies any complaints of fever, chills, nausea, vomiting, abdominal pain, diarrhea, poor appetite, leg swelling, orthopnea, PND or trauma or fall    Review of Systems: as mentioned in the history of present illness.  A Comprehensive review of the other systems is negative.  Past Medical History  Diagnosis Date  . Arthritis   . HTN (hypertension)     Taken off BP meds by PCP when BP normalized   Past Surgical History  Procedure Laterality Date  . Abdominal hysterectomy     Social History:  reports that  she has never smoked. She has never used smokeless tobacco. She reports that she does not drink alcohol or use illicit drugs. Independent for most of her  ADL.  No Known Allergies  Family History  Problem Relation Age of Onset  . Heart disease Father   . Cancer Brother   . Cancer Brother   . Dementia Son     Prior to Admission medications   Medication Sig Start Date End Date Taking? Authorizing Provider  amLODipine (NORVASC) 10 MG tablet Take 10 mg by mouth daily.   Yes Historical Provider, MD  aspirin EC 81 MG tablet Take 81 mg by mouth daily.   Yes Historical Provider, MD  amLODipine (NORVASC) 10 MG tablet Take 1 tablet (10 mg total) by mouth daily. 12/19/11 12/18/12  Maryruth Bun Rama, MD    Physical Exam: Filed Vitals:   09/17/13 1744  BP: 127/58  Resp: 16  SpO2: 96%    General: Alert, Awake and Oriented to Time, Place and Person. Appear in mild distress Eyes: PERRL ENT: Oral Mucosa clear dry. Neck: no JVD Cardiovascular: S1 and S2 Present, no Murmur, Peripheral Pulses Present Respiratory: Bilateral Air entry equal and Decreased, Clear to Auscultation,  no Crackles,no wheezes Abdomen: Bowel Sound Present, Soft and Non tender Skin: no Rash Extremities: trace  Pedal edema, no calf tenderness Neurologic: Mental status lethargy , Cranial Nerves  pupils are reactive , Motor strength  bilateral equal strength , Sensation  bilateral equal , reflexes  intact  , babinski  negative , Cerebellar test  normal finger-nose-finger .  Labs on Admission:  CBC:  Recent Labs Lab 09/17/13 1952 09/17/13 2010  WBC 4.4  --   NEUTROABS 2.8  --   HGB 11.4* 12.6  HCT 34.7* 37.0  MCV 86.8  --   PLT 199  --     CMP     Component Value Date/Time   NA 142 09/17/2013 2010   K 3.5 09/17/2013 2010   CL 105 09/17/2013 2010   CO2 25 11/29/2012 0125   GLUCOSE 82 09/17/2013 2010   BUN 23 09/17/2013 2010   CREATININE 0.90 09/17/2013 2010   CALCIUM 8.6 11/29/2012 0125   PROT 6.5 11/29/2012  0125   ALBUMIN 2.6* 11/29/2012 0125   AST 366* 11/29/2012 0125   ALT 202* 11/29/2012 0125   ALKPHOS 285* 11/29/2012 0125   BILITOT 0.4 11/29/2012 0125   GFRNONAA 68* 11/29/2012 0125   GFRAA 78* 11/29/2012 0125    No results found for this basename: LIPASE, AMYLASE,  in the last 168 hours No results found for this basename: AMMONIA,  in the last 168 hours   Recent Labs Lab 09/17/13 1952  TROPONINI <0.30   BNP (last 3 results) No results found for this basename: PROBNP,  in the last 8760 hours  Radiological Exams on Admission: No results found.  EKG: Independently reviewed. normal EKG, normal sinus rhythm.  Assessment/Plan Principal Problem:   TIA (transient ischemic attack) Active Problems:   Anemia   Hypertension   1. TIA (transient ischemic attack)  the patient is presenting with complaints of on and off right-sided weakness which has improved at present and difficulty with speech. She has undergone a CT scan which does not show any acute abnormality. At present her lab work also appears within normal limits. I would admit her for observation and obtain repeat MRI as well as carotid and echocardiogram. We'll monitor her on telemetry. PTOT and speech therapy consult will also be done. She'll be kept n.p.o. until past swallowing evaluation. Permissive Hypertension overnight  2.Anemia Monitor CBC daily   Consults:  neurology appreciate input   DVT Prophylaxis: subcutaneous Heparin Nutrition:  cardiac diet   Code Status:  full at present   Family Communication:  Dauhter  was present at bedside, opportunity was given to ask question and all questions were answered satisfactorily at the time of interview. Disposition: Admitted to observation telemetry unit.  Author: Lynden Oxford, MD [facility] Pager: [phone] 09/17/2013, 8:58 PM    If 7PM-7AM, please contact night-coverage [email or web address] Password [email or web address]

## 2013-09-17 NOTE — ED Provider Notes (Signed)
CSN: [id number]     Arrival date & time 09/17/13  1711 History   First MD Initiated Contact with Patient 09/17/13 1712     Chief Complaint  Patient presents with  . Stroke Symptoms   (Consider location/radiation/quality/duration/timing/severity/associated sxs/prior Treatment) HPI Comments: Patient is a 79 year old female with history of arthritis and hypertension who presents today after having right-sided facial droop, drooling, aphasia. She had one episode last night that resolved. Her daughter put her to bed. She was doing well this morning and last seen normal at 12:30 PM. At this time she began to again have right-sided facial droop, drooling, aphasia, right-sided weakness. Her daughter took her to [facility]. There was a CT scan done that showed no stroke. The caregiver reports that she was discharged with the diagnosis of a TIA. She was told to come to [facility] because we are a trauma center. The caregiver reports that she does not know the CODE STATUS of this patient.  The history is provided by the patient and a relative. No language interpreter was used.    Past Medical History  Diagnosis Date  . Arthritis   . HTN (hypertension)     Taken off BP meds by PCP when BP normalized   Past Surgical History  Procedure Laterality Date  . Abdominal hysterectomy     Family History  Problem Relation Age of Onset  . Heart disease Father   . Cancer Brother   . Cancer Brother   . Dementia Son    History  Substance Use Topics  . Smoking status: Never Smoker   . Smokeless tobacco: Never Used  . Alcohol Use: No   OB History   Grav Para Term Preterm Abortions TAB SAB Ect Mult Living                 Review of Systems  Constitutional: Negative for fever and chills.  Respiratory: Negative for shortness of breath.   Cardiovascular: Negative for chest pain.  Gastrointestinal: Negative for nausea, vomiting and abdominal pain.  Neurological: Positive for weakness  and numbness.  All other systems reviewed and are negative.    Allergies  Review of patient's allergies indicates no known allergies.  Home Medications   Current Outpatient Rx  Name  Route  Sig  Dispense  Refill  . amLODipine (NORVASC) 10 MG tablet   Oral   Take 10 mg by mouth daily.         Marland Kitchen aspirin EC 81 MG tablet   Oral   Take 81 mg by mouth daily.         Marland Kitchen EXPIRED: amLODipine (NORVASC) 10 MG tablet   Oral   Take 1 tablet (10 mg total) by mouth daily.   30 tablet   3    BP 127/58  Resp 16  SpO2 96% Physical Exam  Nursing note and vitals reviewed. Constitutional: She is oriented to person, place, and time. She appears well-developed and well-nourished. No distress.  HENT:  Head: Normocephalic and atraumatic.  Right Ear: External ear normal.  Left Ear: External ear normal.  Nose: Nose normal.  Mouth/Throat: Oropharynx is clear and moist.  Slight deviation of tongue to left  Eyes: Conjunctivae are normal.  Neck: Normal range of motion.  Cardiovascular: Normal rate, regular rhythm and normal heart sounds.   Pulmonary/Chest: Effort normal and breath sounds normal. No stridor. No respiratory distress. She has no wheezes. She has no rales.  Abdominal: Soft. She exhibits no distension.  Musculoskeletal:  Normal range of motion.  Neurological: She is alert and oriented to person, place, and time. She has normal strength.  Mild right sided weakness.  Skin: Skin is warm and dry. She is not diaphoretic. No erythema.  Psychiatric: She has a normal mood and affect. Her behavior is normal.    ED Course  Procedures (including critical care time) Labs Review Labs Reviewed  GLUCOSE, CAPILLARY  ETHANOL  PROTIME-INR  APTT  CBC  DIFFERENTIAL  COMPREHENSIVE METABOLIC PANEL  TROPONIN I  URINE RAPID DRUG SCREEN (HOSP PERFORMED)  URINALYSIS, ROUTINE W REFLEX MICROSCOPIC  POCT I-STAT, CHEM 8  POCT I-STAT TROPONIN I   Imaging Review No results found.  EKG  Interpretation   None      6:58 PM I was able to obtain the records from her visit to [facility]. CT head showed chronic small vessel ischemia changes and atrophy. Small old left frontal infarct. No acute abnormality seen. All lab work and imaging done is unremarkable.   7:35 PM Discussed case with Dr. Allena Katz. He would like neuro consult and labs to be drawn prior to admission.   7:50 PM Discussed case with Dr. Amada Jupiter who believes patient likely had a stroke. He will see patient.   MDM  No diagnosis found.  Pt presents with right sided facial droop, aphasia, and right sided weakness. I consulted Dr. Amada Jupiter who believes the patient likely had a new stroke. Not a candidate for TPA. Dr. Amada Jupiter will see patient in ED. Vital signs are stable at this time. Stroke order set labs and imaging used. Patient signed out to Schinlever, PA-C at change of shift for admission. Dr. Fayrene Fearing evaluated patient and agrees with plan.     Mora Bellman, PA-C 09/17/13 2045

## 2013-09-17 NOTE — ED Notes (Addendum)
Pt came from home by [facility] with c/o right sided facial droop, drooling, aphasia, and right sided weakness that has resolved. Pt had 2 previous episodes starting last night and this morning. Pt was seen at [facility] and had a CT scan done and was discharged with dx of TIA. Denies any pain at this time.

## 2013-09-17 NOTE — Consult Note (Signed)
Neurology Consultation Reason for Consult: right sided weakness Referring Physician: Leodis Binet  CC: difficulty speaking  History is obtained from: Patient, medical   HPI: Tamara Miller is a 79 y.o. female with a history of htn who presents with right sided weakness and confusion. Family is not present during my interview, but apparently she had an episode of right sided weakness with confusion the night prior to admission, then this morning again had another episode and presented to [facility] medical center where she was diagnosed with TIA. The patient was still symptomatic and came to the [facility] ED for further evaluation.   LKW: 12/14 prior to bed.  tpa given?: no, outside of window.   ROS: A 14 point ROS was performed and is negative except as noted in the HPI.  Past Medical History  Diagnosis Date  . Arthritis   . HTN (hypertension)     Taken off BP meds by PCP when BP normalized    Family History: Unable to assess secondary to patient's altered mental status.    Social History: Tob: Unable to assess secondary to patient's altered mental status.    Exam: Current vital signs: BP 145/75  Pulse 67  Temp(Src) 98.1 F (36.7 C) (Oral)  Resp 16  SpO2 100% Vital signs in last 24 hours: Temp:  [97.9 F (36.6 C)-98.1 F (36.7 C)] 98.1 F (36.7 C) (12/15 2245) Pulse Rate:  [67] 67 (12/15 2245) Resp:  [14-16] 16 (12/15 2245) BP: (124-145)/(58-75) 145/75 mmHg (12/15 2245) SpO2:  [96 %-100 %] 100 % (12/15 2245)  General: in bed, NAD CV: RRR Mental Status: Patient is awake, alert, she is unable to answer any orientation questions.  Unable to answer memory questions.  She does respond to questions, but her responses are nonsensical.  Patient is able to give a clear and coherent history.  Cranial Nerves: II: Acknowledges movements in both visual fields. Pupils are equal, round, and reactive to light.  Discs are difficult to visualize. III,IV, VI: EOMI without  ptosis or diploplia.  V: Facial sensation is symmetric to temperature VII: Facial movement is notable for right sided facial weakness.  VIII: hearing is intact to voice X: Uvula elevates symmetrically XI: Shoulder shrug is symmetric. XII: tongue is midline without atrophy or fasciculations.  Motor: Tone is normal. Bulk is normal. 5/5 strength on left, possible mild right arm and leg weakness.  Sensory: Sensation is symmetric to light touch and temperature in the arms and legs. Deep Tendon Reflexes: 2+ and symmetric in the biceps and patellae.  Cerebellar: Difficulty with following command to perform FNF Gait: Not assessed due to patient safety concerns.    I have reviewed labs in epic and the results pertinent to this consultation are: CMP unremarkable  I have reviewed the images obtained:CT head - no acute findings  Impression: 79 yo F with right facial droop and possible aphasia. Her seech pattern could be due to delirium, but a mild aphasia is also possible and the right facial droop would support this. I suspect that she has had a small left cortical infarct.   Recommendations: 1. HgbA1c, fasting lipid panel 2. MRI, MRA  of the brain without contrast 3. Frequent neuro checks 4. Echocardiogram 5. Carotid dopplers 6. Prophylactic therapy-Antiplatelet med: Aspirin - dose 325mg  7. Risk factor modification 8. Telemetry monitoring 9. PT consult, OT consult, Speech consult    Ritta Slot, MD [facility] [phone]  If 7pm- 7am, please page neurology on call at [phone].

## 2013-09-18 ENCOUNTER — Encounter (HOSPITAL_COMMUNITY): Payer: Self-pay | Admitting: *Deleted

## 2013-09-18 ENCOUNTER — Observation Stay (HOSPITAL_COMMUNITY): Payer: Medicare Other

## 2013-09-18 DIAGNOSIS — I059 Rheumatic mitral valve disease, unspecified: Secondary | ICD-10-CM

## 2013-09-18 DIAGNOSIS — I639 Cerebral infarction, unspecified: Secondary | ICD-10-CM | POA: Diagnosis present

## 2013-09-18 DIAGNOSIS — I6350 Cerebral infarction due to unspecified occlusion or stenosis of unspecified cerebral artery: Principal | ICD-10-CM

## 2013-09-18 LAB — URINE MICROSCOPIC-ADD ON

## 2013-09-18 LAB — COMPREHENSIVE METABOLIC PANEL
ALT: 6 U/L (ref 0–35)
AST: 16 U/L (ref 0–37)
CO2: 24 mEq/L (ref 19–32)
Calcium: 9.2 mg/dL (ref 8.4–10.5)
Creatinine, Ser: 0.62 mg/dL (ref 0.50–1.10)
GFR calc Af Amer: 82 mL/min — ABNORMAL LOW (ref >90)
GFR calc non Af Amer: 71 mL/min — ABNORMAL LOW (ref >90)
Glucose, Bld: 78 mg/dL (ref 70–99)
Total Bilirubin: 0.3 mg/dL (ref 0.3–1.2)

## 2013-09-18 LAB — CBC WITH DIFFERENTIAL/PLATELET
Basophils Absolute: 0 10*3/uL (ref 0.0–0.1)
Eosinophils Relative: 3 % (ref 0–5)
HCT: 35.7 % — ABNORMAL LOW (ref 36.0–46.0)
Hemoglobin: 12.1 g/dL (ref 12.0–15.0)
Lymphocytes Relative: 31 % (ref 12–46)
MCHC: 33.9 g/dL (ref 30.0–36.0)
MCV: 85 fL (ref 78.0–100.0)
Monocytes Absolute: 0.3 10*3/uL (ref 0.1–1.0)
Monocytes Relative: 10 % (ref 3–12)
RBC: 4.2 MIL/uL (ref 3.87–5.11)
RDW: 14 % (ref 11.5–15.5)
WBC: 3.1 10*3/uL — ABNORMAL LOW (ref 4.0–10.5)

## 2013-09-18 LAB — URINALYSIS, ROUTINE W REFLEX MICROSCOPIC
Bilirubin Urine: NEGATIVE
Glucose, UA: NEGATIVE mg/dL
Ketones, ur: NEGATIVE mg/dL
Protein, ur: NEGATIVE mg/dL
Urobilinogen, UA: 0.2 mg/dL (ref 0.0–1.0)
pH: 7 (ref 5.0–8.0)

## 2013-09-18 LAB — LIPID PANEL
LDL Cholesterol: 130 mg/dL — ABNORMAL HIGH (ref 0–99)
Total CHOL/HDL Ratio: 2.7 RATIO

## 2013-09-18 LAB — RAPID URINE DRUG SCREEN, HOSP PERFORMED
Amphetamines: NOT DETECTED
Benzodiazepines: NOT DETECTED
Cocaine: NOT DETECTED
Opiates: NOT DETECTED
Tetrahydrocannabinol: NOT DETECTED

## 2013-09-18 LAB — PROTIME-INR
INR: 0.96 (ref 0.00–1.49)
Prothrombin Time: 12.6 seconds (ref 11.6–15.2)

## 2013-09-18 LAB — HEMOGLOBIN A1C: Hgb A1c MFr Bld: 5.9 % — ABNORMAL HIGH (ref <5.7)

## 2013-09-18 MED ORDER — POTASSIUM CHLORIDE CRYS ER 20 MEQ PO TBCR
40.0000 meq | EXTENDED_RELEASE_TABLET | Freq: Once | ORAL | Status: AC
  Administered 2013-09-18: 40 meq via ORAL
  Filled 2013-09-18: qty 2

## 2013-09-18 MED ORDER — ASPIRIN EC 325 MG PO TBEC
325.0000 mg | DELAYED_RELEASE_TABLET | Freq: Every day | ORAL | Status: DC
  Administered 2013-09-18 – 2013-09-21 (×4): 325 mg via ORAL
  Filled 2013-09-18 (×4): qty 1

## 2013-09-18 MED ORDER — DEXTROSE 5 % IV SOLN
1.0000 g | INTRAVENOUS | Status: DC
  Administered 2013-09-18 – 2013-09-21 (×4): 1 g via INTRAVENOUS
  Filled 2013-09-18 (×5): qty 10

## 2013-09-18 MED ORDER — SIMVASTATIN 20 MG PO TABS
20.0000 mg | ORAL_TABLET | Freq: Every day | ORAL | Status: DC
  Administered 2013-09-18 – 2013-09-21 (×4): 20 mg via ORAL
  Filled 2013-09-18 (×4): qty 1

## 2013-09-18 NOTE — Progress Notes (Signed)
OT NOTE  OT spoke directly to family and they are requesting SNF search. Family are attempting to arrange 24/ 7 home care. Please have Social worker speak with family regarding SNF placement.   Mateo Flow   OTR/L Pager: [phone] Office: [phone] .

## 2013-09-18 NOTE — Progress Notes (Signed)
Stroke Team Progress Note  HISTORY Tamara Miller is a 79 y.o. female with a history of htn who presents with right sided weakness and confusion. Family is not present during interview, but apparently she had an episode of right sided weakness with confusion the night prior to admission 09/16/2013, then this morning 09/17/2013 again had another episode and presented to [facility] medical center where she was diagnosed with TIA. The patient was still symptomatic and came to the [facility] ED for further evaluation.  Patient was not a TPA candidate secondary to delay in arrival. She was admitted for further evaluation and treatment.  SUBJECTIVE Her granddaughter is at the bedside.  Overall she feels her condition is gradually improving, though she is unsure. pts daughter is on the way here.  OBJECTIVE Most recent Vital Signs: Filed Vitals:   09/18/13 0037 09/18/13 0209 09/18/13 0546 09/18/13 0855  BP: 137/66 153/77 165/79 125/50  Pulse: 74 78 59 60  Temp: 97.6 F (36.4 C) 97.3 F (36.3 C) 98.3 F (36.8 C) 97.4 F (36.3 C)  TempSrc: Axillary Axillary Axillary Oral  Resp: 16 16 16 16   SpO2: 99% 99% 100% 100%   CBG (last 3)   Recent Labs  09/17/13 1948  GLUCAP 94    IV Fluid Intake:     MEDICATIONS  . amLODipine  10 mg Oral Daily  . aspirin EC  325 mg Oral Daily  . heparin  5,000 Units Subcutaneous Q8H  . potassium chloride  40 mEq Oral Once   PRN:    Diet:  Cardiac thin liquids Activity:   Bathroom privileges with assistance DVT Prophylaxis:  Heparin 5000 units sq tid   CLINICALLY SIGNIFICANT STUDIES Basic Metabolic Panel:   Recent Labs Lab 09/17/13 1952 09/17/13 2010 09/18/13 0755  NA 142 142 138  K 3.6 3.5 3.2*  CL 105 105 103  CO2 23  --  24  GLUCOSE 81 82 78  BUN 24* 23 17  CREATININE 0.77 0.90 0.62  CALCIUM 9.1  --  9.2   Liver Function Tests:   Recent Labs Lab 09/17/13 1952 09/18/13 0755  AST 19 16  ALT 7 6  ALKPHOS 93 94  BILITOT 0.2*  0.3  PROT 6.8 6.9  ALBUMIN 3.4* 3.4*   CBC:   Recent Labs Lab 09/17/13 1952 09/17/13 2010 09/18/13 0755  WBC 4.4  --  3.1*  NEUTROABS 2.8  --  1.7  HGB 11.4* 12.6 12.1  HCT 34.7* 37.0 35.7*  MCV 86.8  --  85.0  PLT 199  --  193   Coagulation:   Recent Labs Lab 09/17/13 1952 09/18/13 0755  LABPROT 12.6 12.6  INR 0.96 0.96   Cardiac Enzymes:   Recent Labs Lab 09/17/13 1952  TROPONINI <0.30   Urinalysis: No results found for this basename: COLORURINE, APPERANCEUR, LABSPEC, PHURINE, GLUCOSEU, HGBUR, BILIRUBINUR, KETONESUR, PROTEINUR, UROBILINOGEN, NITRITE, LEUKOCYTESUR,  in the last 168 hours Lipid Panel    Component Value Date/Time   CHOL 226* 09/18/2013 0427   TRIG 59 09/18/2013 0427   HDL 84 09/18/2013 0427   CHOLHDL 2.7 09/18/2013 0427   VLDL 12 09/18/2013 0427   LDLCALC 130* 09/18/2013 0427   HgbA1C  No results found for this basename: HGBA1C    Urine Drug Screen:   No results found for this basename: labopia,  cocainscrnur,  labbenz,  amphetmu,  thcu,  labbarb    Alcohol Level:   Recent Labs Lab 09/17/13 1952  ETH <11    CT  of the brain  09/17/2013   1. Degraded examination without definite acute intracranial process. 2. Interval progression of now severe atrophy and microvascular ischemic disease.   MRI of the brain    MRA of the brain    2D Echocardiogram    Carotid Doppler  Bilateral: 1-39% ICA stenosis. Vertebral artery flow is antegrade.   CXR    EKG  normal sinus rhythm.   Therapy Recommendations HH PT  Physical Exam   Frail elderly african american lady not in distress.Awake alert. Afebrile. Head is nontraumatic. Neck is supple without bruit. Hearing is diminishedl. Cardiac exam no murmur or gallop. Lungs are clear to auscultation. Distal pulses are well felt. Neurological Exam ;  Awake  Alert oriented x 2.Mildly dysarthric speech and normal language.eye movements full without nystagmus.fundi were not visualized. Vision acuity  and fields appear normal. Hearing is diminished bilaterally. Palatal movements are normal. Face asymmetric with mild left lower face weak. Tongue midline. Normal strength, tone, reflexes and coordination. Normal sensation. Gait deferred. ASSESSMENT Ms. Tamara Miller is a 79 y.o. female presenting with right sided weakness and confusion. Imaging  shows a small left basal ganglia subcortical infarct. Infarct felt to be thrombotic secondary to small vessel disease .  On aspirin 81 mg orally every day prior to admission. Now on aspirin 325 mg orally every day for secondary stroke prevention. Patient with mild resultant right hemiparesis, improving. Work up underway.  Hypertension Hyperlipidemia, LDL 130, on no statin PTA, now on no statin, goal LDL < 100  arthritis  Hospital day # 1  TREATMENT/PLAN  Continue aspirin 325 mg orally every day for secondary stroke prevention.  F/u MRI, MRA, 2D and carotid doppler, HgbA1c  Add low dose statin  D/w grand daughter   Annie Main, MSN, RN, ANVP-BC, ANP-BC, [facility] Pager: [phone] 09/18/2013 11:23 AM  I have personally obtained a history, examined the patient, evaluated imaging results, and formulated the assessment and plan of care. I agree with the above. Delia Heady, MD

## 2013-09-18 NOTE — Progress Notes (Signed)
[facility] PROGRESS NOTE  Tamara Miller BJY:[id number] DOB: [birth year] DOA: 09/17/2013 PCP: [name], MD  Assessment/Plan: TIA (transient ischemic attack)  -CT scan which does not show any acute abnormality.  - MRI  -carotid  -echocardiogram.  PT/OT and speech therapy consult will also be done.     Anemia  Monitor CBC daily  Hypokalemia -replete  ? Thrush -will re-eval after mouth care  Code Status: full Family Communication: patient/granddaughter Disposition Plan: obs   Consultants:  Neuro  PT/OT  Procedures:  MRI  Echo  carotids  Antibiotics:    HPI/Subjective: Feeling well Granddaughter at bedside No new c/o  Objective: Filed Vitals:   09/18/13 0855  BP: 125/50  Pulse: 60  Temp: 97.4 F (36.3 C)  Resp: 16    Intake/Output Summary (Last 24 hours) at 09/18/13 1014 Last data filed at 09/18/13 0854  Gross per 24 hour  Intake    120 ml  Output      0 ml  Net    120 ml   There were no vitals filed for this visit.  Exam:   General:  Pleasant/cooperative  Cardiovascular: rrr  Respiratory: clear anterior  Abdomen: +BS, soft, NT  Musculoskeletal: moves all 4 ext   Data Reviewed: Basic Metabolic Panel:  Recent Labs Lab 09/17/13 1952 09/17/13 2010 09/18/13 0755  NA 142 142 138  K 3.6 3.5 3.2*  CL 105 105 103  CO2 23  --  24  GLUCOSE 81 82 78  BUN 24* 23 17  CREATININE 0.77 0.90 0.62  CALCIUM 9.1  --  9.2   Liver Function Tests:  Recent Labs Lab 09/17/13 1952 09/18/13 0755  AST 19 16  ALT 7 6  ALKPHOS 93 94  BILITOT 0.2* 0.3  PROT 6.8 6.9  ALBUMIN 3.4* 3.4*   No results found for this basename: LIPASE, AMYLASE,  in the last 168 hours No results found for this basename: AMMONIA,  in the last 168 hours CBC:  Recent Labs Lab 09/17/13 1952 09/17/13 2010 09/18/13 0755  WBC 4.4  --  3.1*  NEUTROABS 2.8  --  1.7  HGB 11.4* 12.6 12.1  HCT 34.7* 37.0 35.7*  MCV 86.8  --  85.0  PLT 199  --   193   Cardiac Enzymes:  Recent Labs Lab 09/17/13 1952  TROPONINI <0.30   BNP (last 3 results) No results found for this basename: PROBNP,  in the last 8760 hours CBG:  Recent Labs Lab 09/17/13 1948  GLUCAP 94    No results found for this or any previous visit (from the past 240 hour(s)).   Studies: Ct Head Wo Contrast  09/17/2013   CLINICAL DATA:  Right-sided facial droop, drooling, ectasia, right-sided weakness  EXAM: CT HEAD WITHOUT CONTRAST  TECHNIQUE: Contiguous axial images were obtained from the base of the skull through the vertex without intravenous contrast.  COMPARISON:  04/18/2005  FINDINGS: The examination is degraded due to patient motion artifact necessitating the acquisition of additional images.  There is a rather extensive sulcal prominence and centralized volume loss with commensurate ex vacuo dilatation of the ventricular system, progressed since remote prior examination performed in 2006. There are worsening, now rather extensive periventricular hypodensities compatible with microvascular ischemic disease. Old lacunar infarct within in the left basal ganglia. Given background parenchymal abnormalities and limitations of the examination, there is no definite CT evidence of acute large territory infarct. No intraparenchymal or extra-axial mass or hemorrhage. Unchanged configuration of the ventricles and basilar  cisterns. No midline shift. Intracranial atherosclerosis. There is minimal opacification of the left posterior ethmoidal air cells. The remaining paranasal sinuses and mastoid air cells are normally aerated. Post bilateral cataract surgery. Regional soft tissues are normal. No displaced calvarial fracture.  IMPRESSION: 1. Degraded examination without definite acute intracranial process. 2. Interval progression of now severe atrophy and microvascular ischemic disease.   Electronically Signed   By: Simonne Come M.D.   On: 09/17/2013 21:15    Scheduled Meds: .  amLODipine  10 mg Oral Daily  . aspirin EC  325 mg Oral Daily  . heparin  5,000 Units Subcutaneous Q8H   Continuous Infusions:   Principal Problem:   TIA (transient ischemic attack) Active Problems:   Anemia   Hypertension   Right sided weakness    Time spent: 35 min    Tamara Miller  [facility] Pager [phone] If 7PM-7AM, please contact night-coverage at [email or web address], password [facility] 09/18/2013, 10:14 AM  LOS: 1 day

## 2013-09-18 NOTE — Progress Notes (Signed)
*  PRELIMINARY RESULTS* Vascular Ultrasound Carotid Duplex (Doppler) has been completed.  Preliminary findings: Bilateral:  1-39% ICA stenosis.  Vertebral artery flow is antegrade.      Farrel Demark, RDMS, RVT  09/18/2013, 9:29 AM

## 2013-09-18 NOTE — Evaluation (Signed)
Occupational Therapy Evaluation Patient Details Name: Tamara Miller MRN: [id number] DOB: Apr 22, 1910 Today's Date: 09/18/2013 Time: 4098-1191 OT Time Calculation (min): 30 min  OT Assessment / Plan / Recommendation History of present illness 79 yo female admitted with sudden onset slurred speech and R sided weakness. MRI (+) posterior internal capsule infarct     Clinical Impression   PT admitted with Rt side weakness and slurred. Pt currently with functional limitiations due to the deficits listed below (see OT problem list).  Pt will benefit from skilled OT to increase their independence and safety with adls and balance to allow discharge SNF. Pt could d/c home with family if 24/ 7 (A) can be arranged     OT Assessment  Patient needs continued OT Services    Follow Up Recommendations  SNF    Barriers to Discharge      Equipment Recommendations  3 in 1 bedside comode    Recommendations for Other Services    Frequency  Min 2X/week    Precautions / Restrictions Precautions Precautions: Fall Precaution Comments: pt with urinary incontinence.  Family to bring in briefs.   Restrictions Weight Bearing Restrictions: No   Pertinent Vitals/Pain None reported    ADL  Eating/Feeding: Set up Where Assessed - Eating/Feeding: Chair Grooming: Moderate assistance Where Assessed - Grooming: Supported sitting Lower Body Dressing: Maximal assistance Where Assessed - Lower Body Dressing: Supported sit to Pharmacist, hospital: Maximal assistance Toilet Transfer Method: Sit to Barista: Regular height toilet Equipment Used: Gait belt Transfers/Ambulation Related to ADLs: Pt ambulated mod (A) to chair . Pt with fear of falling and needs to initiate task of standing to complete it ADL Comments: Pt very fixated on items in bed and cognitive deficits. Pt provided linens to fold at the end of session to keep from pulling at IV. Family attempting to arrange 24/7  (A)    OT Diagnosis: Generalized weakness;Cognitive deficits  OT Problem List: Decreased strength;Decreased activity tolerance;Impaired balance (sitting and/or standing);Decreased safety awareness;Decreased knowledge of use of DME or AE;Decreased knowledge of precautions OT Treatment Interventions: Self-care/ADL training;DME and/or AE instruction;Therapeutic activities;Patient/family education;Balance training;Therapeutic exercise   OT Goals(Current goals can be found in the care plan section) Acute Rehab OT Goals Patient Stated Goal: None stated OT Goal Formulation: With patient/family Time For Goal Achievement: 10/02/13 Potential to Achieve Goals: Good  Visit Information  Last OT Received On: 09/18/13 Assistance Needed: +2 (for ambulation) History of Present Illness: 79 yo female admitted with sudden onset slurred speech and R sided weakness. MRI (+) posterior internal capsule infarct         Prior Functioning     Home Living Family/patient expects to be discharged to:: Private residence Living Arrangements: Alone Available Help at Discharge: Family;Available PRN/intermittently Type of Home: House Home Access: Stairs to enter [employer] of Steps: 1 Entrance Stairs-Rails: None Home Layout: One level Home Equipment: Cane - single point;Walker - 2 wheels Additional Comments: pt's daughter lives nextdoor and goes back and forth to pt's home all day and stays with pt at night.  Daughter is also caring for a 29 yr old and 60 yr old.  Granddaughter lives away and tries to A as she can.   Prior Function Level of Independence: Needs assistance Gait / Transfers Assistance Needed: Uses a RW ADL's / Homemaking Assistance Needed: Family performs all homemaking and A pt with her bathing and dressing, though pt does as much as she can.   Communication / Swallowing  Assistance Needed: pt HOH, but responds to questions.   Communication Communication: HOH          Vision/Perception Vision - History Baseline Vision: Wears glasses all the time Patient Visual Report:  (currently without glasses / ) Vision - Assessment Additional Comments: cognitive deficits make visual assessment difficult   Cognition  Cognition Arousal/Alertness: Awake/alert Behavior During Therapy: WFL for tasks assessed/performed Overall Cognitive Status: History of cognitive impairments - at baseline Memory: Decreased short-term memory    Extremity/Trunk Assessment Upper Extremity Assessment Upper Extremity Assessment: Overall WFL for tasks assessed;Generalized weakness Lower Extremity Assessment RLE Deficits / Details: Knee arthritis with flexion contractures ~10-15 degrees.   LLE Deficits / Details: Knee arthritis with flexion contractures ~10-15 degrees.   Cervical / Trunk Assessment Cervical / Trunk Assessment: Kyphotic     Mobility Bed Mobility Bed Mobility: Supine to Sit;Sitting - Scoot to Edge of Bed Supine to Sit: 3: Mod assist Sitting - Scoot to Edge of Bed: 3: Mod assist Details for Bed Mobility Assistance: needed (A) to sequence Transfers Transfers: Sit to Stand;Stand to Sit Sit to Stand: With upper extremity assist;From bed;2: Max assist Stand to Sit: With upper extremity assist;To bed;3: Mod assist Details for Transfer Assistance: Max (A) and posterior lean due to fear of falling     Exercise     Balance Balance Balance Assessed: Yes   End of Session OT - End of Session Activity Tolerance: Patient tolerated treatment well Patient left: in chair;with call bell/phone within reach;with family/visitor present Nurse Communication: Mobility status;Precautions  GO     Harolyn Rutherford 09/18/2013, 4:12 PM Pager: [phone]

## 2013-09-18 NOTE — Progress Notes (Signed)
[facility]  Patient Status: Active (receiving services up to time of hospitalization)  [facility] is providing the following services: PT and HHA  If patient discharges after hours, please call [phone].   Jodene Nam 09/18/2013, 5:29 PM

## 2013-09-18 NOTE — Progress Notes (Signed)
UR completed 

## 2013-09-18 NOTE — Evaluation (Signed)
Physical Therapy Evaluation Patient Details Name: Tamara Miller MRN: [id number] DOB: 1910/02/13 Today's Date: 09/18/2013 Time: 1478-2956 PT Time Calculation (min): 24 min  PT Assessment / Plan / Recommendation History of Present Illness  pt presents with sudden onset slurred speech and R sided weakness.    Clinical Impression  Pt very agreeable to mobility, but limited by urinary incontinence.  Pt lives alone and has family that A her, but not 24/7.  Pt's granddaughter present during PT and indicates concern for pt's daughter being able to continue to care for the pt as daughter is 57 yrs old and raising a 49 yr old and 77 yr old on top of trying to care for pt.  At this time unclear if family will be able to continue to care for pt, but would benefit from [facility], HHPT, and HHOT to A at home.      PT Assessment  Patient needs continued PT services    Follow Up Recommendations  Home health PT;Supervision/Assistance - 24 hour    Does the patient have the potential to tolerate intense rehabilitation      Barriers to Discharge Decreased caregiver support Per granddaughter, pt's daughter tries to care for pt and is raising 2 children.  Granddaughter concerned.      Equipment Recommendations  Wheelchair (measurements PT);Wheelchair cushion (measurements PT)    Recommendations for Other Services OT consult   Frequency Min 4X/week    Precautions / Restrictions Precautions Precautions: Fall Precaution Comments: pt with urinary incontinence.  Family to bring in briefs.   Restrictions Weight Bearing Restrictions: No   Pertinent Vitals/Pain Denied pain.        Mobility  Bed Mobility Bed Mobility: Supine to Sit;Sitting - Scoot to Edge of Bed Supine to Sit: 3: Mod assist Sitting - Scoot to Edge of Bed: 3: Mod assist Details for Bed Mobility Assistance: A bringing hips to EOB and trunk up to sitting.   Transfers Transfers: Sit to Stand;Stand to Sit Sit to Stand: 3: Mod  assist;With upper extremity assist;From bed Stand to Sit: 4: Min assist;With upper extremity assist;To bed Details for Transfer Assistance: pt initially ModA to come to stand, then quickly became MinA once pt adjusted her BOS.  pt remains flexed and in somewhat crouched position.   Ambulation/Gait Ambulation/Gait Assistance: Not tested (comment) Stairs: No Wheelchair Mobility Wheelchair Mobility: No Modified Rankin (Stroke Patients Only) Pre-Morbid Rankin Score: Moderate disability Modified Rankin: Severe disability    Exercises     PT Diagnosis: Difficulty walking  PT Problem List: Decreased strength;Decreased activity tolerance;Decreased balance;Decreased mobility;Decreased cognition;Decreased knowledge of use of DME;Decreased safety awareness PT Treatment Interventions: DME instruction;Gait training;Stair training;Functional mobility training;Therapeutic activities;Therapeutic exercise;Balance training;Neuromuscular re-education;Cognitive remediation;Patient/family education     PT Goals(Current goals can be found in the care plan section) Acute Rehab PT Goals Patient Stated Goal: None stated PT Goal Formulation: With patient/family Time For Goal Achievement: 10/02/13 Potential to Achieve Goals: Fair  Visit Information  Last PT Received On: 09/18/13 Assistance Needed: +2 (for ambulation) History of Present Illness: pt presents with sudden onset slurred speech and R sided weakness.         Prior Functioning  Home Living Family/patient expects to be discharged to:: Private residence Living Arrangements: Alone Available Help at Discharge: Family;Available PRN/intermittently Type of Home: House Home Access: Stairs to enter [employer] of Steps: 1 Entrance Stairs-Rails: None Home Layout: One level Home Equipment: Cane - single point;Walker - 2 wheels Additional Comments: pt's daughter lives nextdoor and goes  back and forth to pt's home all day and stays with pt at  night.  Daughter is also caring for a 38 yr old and 32 yr old.  Granddaughter lives away and tries to A as she can.   Prior Function Level of Independence: Needs assistance Gait / Transfers Assistance Needed: Uses a RW ADL's / Homemaking Assistance Needed: Family performs all homemaking and A pt with her bathing and dressing, though pt does as much as she can.   Communication / Swallowing Assistance Needed: pt HOH, but responds to questions.   Communication Communication: HOH    Cognition  Cognition Arousal/Alertness: Awake/alert Behavior During Therapy: WFL for tasks assessed/performed Overall Cognitive Status: History of cognitive impairments - at baseline Memory: Decreased short-term memory    Extremity/Trunk Assessment Upper Extremity Assessment Upper Extremity Assessment: Defer to OT evaluation Lower Extremity Assessment Lower Extremity Assessment: Generalized weakness;RLE deficits/detail;LLE deficits/detail RLE Deficits / Details: Knee arthritis with flexion contractures ~10-15 degrees.   LLE Deficits / Details: Knee arthritis with flexion contractures ~10-15 degrees.   Cervical / Trunk Assessment Cervical / Trunk Assessment: Kyphotic   Balance Balance Balance Assessed: Yes Static Sitting Balance Static Sitting - Balance Support: Bilateral upper extremity supported;Feet supported Static Sitting - Level of Assistance: 5: Stand by assistance Static Standing Balance Static Standing - Balance Support: Bilateral upper extremity supported;During functional activity Static Standing - Level of Assistance: 4: Min assist Static Standing - Comment/# of Minutes: pt initially requires ModA to come to stand, but becomes MinA to maintain standing with total A for peri hygiene.    End of Session PT - End of Session Equipment Utilized During Treatment: Gait belt Activity Tolerance: Patient tolerated treatment well Patient left: in bed;with call bell/phone within reach;with  family/visitor present Nurse Communication: Mobility status  GP Functional Assessment Tool Used: clinical judgement Functional Limitation: Mobility: Walking and moving around Mobility: Walking and Moving Around Current Status (E4540): At least 20 percent but less than 40 percent impaired, limited or restricted Mobility: Walking and Moving Around Goal Status [phone]): 0 percent impaired, limited or restricted   Sunny Schlein, [place] [phone] 09/18/2013, 9:50 AM

## 2013-09-19 NOTE — Progress Notes (Signed)
Thank you for consult on Tamara Miller. She has had poor ability to participate in therapies and doubt that she'll be able to tolerate  3hrs/daily therapy schedule.  Daughter feels that low level therapies with few months in rehab would be more appropriate for patient. She is unable to provide assistance needed and is discussing care needed with other family members.  Will defer consult for now.

## 2013-09-19 NOTE — Progress Notes (Signed)
Occupational Therapy Treatment Patient Details Name: Tamara Miller MRN: [id number] DOB: [birth year] Today's Date: 09/19/2013 Time: 7829-5621 OT Time Calculation (min): 23 min  OT Assessment / Plan / Recommendation  History of present illness 79 yo female admitted with sudden onset slurred speech and R sided weakness. MRI (+) posterior internal capsule infarct     OT comments  Pt very lethargic this AM affecting all adls. Pt needed total (A) for bed mobility and self feeding due to fatigue. Pt positioned in chair and reports comfort in chair.  Follow Up Recommendations  SNF    Barriers to Discharge       Equipment Recommendations  3 in 1 bedside comode    Recommendations for Other Services    Frequency Min 2X/week   Progress towards OT Goals Progress towards OT goals: Progressing toward goals  Plan Discharge plan remains appropriate    Precautions / Restrictions Precautions Precautions: Fall Precaution Comments: pt with urinary incontinence.  Family to bring in briefs.   Restrictions Weight Bearing Restrictions: No   Pertinent Vitals/Pain "i'm cold" - pt found to be incontinent of bladder  Pt provided new clothing and warm blanket    ADL  Eating/Feeding: +1 Total assistance Where Assessed - Eating/Feeding: Chair (very lethargic) Grooming: Wash/dry face;+1 Total assistance Where Assessed - Grooming: Supported sitting Upper Body Dressing: +1 Total assistance Where Assessed - Upper Body Dressing: Unsupported sitting Toilet Transfer: Maximal assistance Toilet Transfer Method: Sit to stand Toilet Transfer Equipment: Regular height toilet Equipment Used: Gait belt Transfers/Ambulation Related to ADLs: Pt required total +2 70 % sit<>Stand with posterior lean due to lethargic. Pt closing eyes 80% of session ADL Comments: Pt incontinent supine in bed this AM. Pt log rolled for peri hygiene. Pt total (A) to complete supine<>sit. Pt reports being hungry when asked. Pt  opening eyes and engaged to transfer to chair with posterior lean. Pt with fear of falling. pt positioned in chair. Pt take ~4 bites and 2 sips of food total (A). pt becoming more lethargic so feeding stopped by OT. Tech [employer] notified of need for assistance this AM with feeding and will need to be more aroused.     OT Diagnosis:    OT Problem List:   OT Treatment Interventions:     OT Goals(current goals can now be found in the care plan section) Acute Rehab OT Goals Patient Stated Goal: None stated OT Goal Formulation: With patient/family Time For Goal Achievement: 10/02/13 Potential to Achieve Goals: Good ADL Goals Pt Will Perform Grooming: with min assist;sitting Pt Will Perform Upper Body Bathing: with min assist;sitting Pt Will Perform Lower Body Bathing: with mod assist;sit to/from stand Pt Will Transfer to Toilet: with min assist;bedside commode  Visit Information  Last OT Received On: 09/19/13 Assistance Needed: +2 (ambulation) History of Present Illness: 79 yo female admitted with sudden onset slurred speech and R sided weakness. MRI (+) posterior internal capsule infarct      Subjective Data      Prior Functioning       Cognition  Cognition Arousal/Alertness: Lethargic Behavior During Therapy: Flat affect Overall Cognitive Status: Impaired/Different from baseline Area of Impairment: Attention;Memory;Awareness Current Attention Level: Focused Memory: Decreased short-term memory Awareness: Anticipatory;Emergent    Mobility  Bed Mobility Bed Mobility: Supine to Sit;Sitting - Scoot to Edge of Bed Supine to Sit: 1: +1 Total assist;HOB flat Sitting - Scoot to Edge of Bed: 1: +1 Total assist Details for Bed Mobility Assistance: pt needed total (A) to sequence  task and arouse. pt opening eyes with verbal cues to open eyes and name call Transfers Transfers: Sit to Stand;Stand to Sit Sit to Stand: 1: +2 Total assist;From bed Sit to Stand: Patient Percentage:  70% Stand to Sit: 2: Max assist;To chair/3-in-1 Details for Transfer Assistance: Pt lethargic this AM affecting participation    Exercises      Balance Balance Balance Assessed: Yes Static Sitting Balance Static Sitting - Balance Support: No upper extremity supported;Feet supported Static Sitting - Level of Assistance: 3: Mod assist   End of Session OT - End of Session Activity Tolerance: Patient limited by lethargy Patient left: in chair;with call bell/phone within reach;with chair alarm set Nurse Communication: Mobility status;Precautions  GO     Boone Master B 09/19/2013, 8:50 AM Pager: [phone]

## 2013-09-19 NOTE — Progress Notes (Signed)
Physical Therapy Treatment Patient Details Name: Tamara Miller MRN: [id number] DOB: 1909-11-01 Today's Date: 09/19/2013 Time: 4098-1191 PT Time Calculation (min): 22 min  PT Assessment / Plan / Recommendation  History of Present Illness 79 yo female admitted with sudden onset slurred speech and R sided weakness. MRI (+) posterior internal capsule infarct     PT Comments   Pt initially agreeable to OOB mobility, but once sitting EOB states her knees feel "old and tired" with pt requesting to return to resting.  Will continue to follow.    Follow Up Recommendations  SNF     Does the patient have the potential to tolerate intense rehabilitation     Barriers to Discharge        Equipment Recommendations  Wheelchair (measurements PT);Wheelchair cushion (measurements PT)    Recommendations for Other Services    Frequency Min 4X/week   Progress towards PT Goals Progress towards PT goals: Progressing toward goals  Plan Discharge plan needs to be updated    Precautions / Restrictions Precautions Precautions: Fall Precaution Comments: pt with urinary incontinence.  Family to bring in briefs.   Restrictions Weight Bearing Restrictions: No   Pertinent Vitals/Pain bil knees with hx arthritis.      Mobility  Bed Mobility Bed Mobility: Supine to Sit;Sitting - Scoot to [place] of Bed;Sit to Supine Supine to Sit: 3: Mod assist Sitting - Scoot to Edge of Bed: 2: Max assist Sit to Supine: 3: Mod assist Details for Bed Mobility Assistance: pt attempting to sit up on her own and needed A to bring LE OOB and for bringing trunk up to sitting.   Transfers Transfers: Not assessed Ambulation/Gait Ambulation/Gait Assistance: Not tested (comment) Stairs: No Wheelchair Mobility Wheelchair Mobility: No Modified Rankin (Stroke Patients Only) Pre-Morbid Rankin Score: Moderate disability Modified Rankin: Severe disability    Exercises     PT Diagnosis:    PT Problem List:   PT Treatment  Interventions:     PT Goals (current goals can now be found in the care plan section) Acute Rehab PT Goals Patient Stated Goal: None stated Time For Goal Achievement: 10/02/13 Potential to Achieve Goals: Fair  Visit Information  Last PT Received On: 09/19/13 Assistance Needed: +2 (ambulation) History of Present Illness: 79 yo female admitted with sudden onset slurred speech and R sided weakness. MRI (+) posterior internal capsule infarct      Subjective Data  Patient Stated Goal: None stated   Cognition  Cognition Arousal/Alertness:  (Drowsy, but arousable.  ) Behavior During Therapy: Flat affect Overall Cognitive Status: Impaired/Different from baseline Area of Impairment: Attention;Memory;Awareness Current Attention Level: Focused Memory: Decreased short-term memory Awareness: Anticipatory;Emergent    Balance  Balance Balance Assessed: No  End of Session PT - End of Session Activity Tolerance: Patient limited by fatigue Patient left: in bed;with call bell/phone within reach;with bed alarm set;with family/visitor present Nurse Communication: Mobility status   GP     Sunny Schlein, [place] [phone] 09/19/2013, 2:25 PM

## 2013-09-19 NOTE — Progress Notes (Addendum)
[facility] PROGRESS NOTE  Tamara Miller XBJ:[id number] DOB: 1910/07/01 DOA: 09/17/2013 PCP: [name], MD  Assessment/Plan: TIA (transient ischemic attack)  -CT scan which does not show any acute abnormality.  - MRI + -carotid: Bilateral: 1-39% ICA stenosis. Vertebral artery flow is antegrade -echocardiogram:  Study Conclusions - Left ventricle: The cavity size was normal. Wall thickness was increased in a pattern of mild LVH. There was mild concentric hypertrophy. Systolic function was normal. The estimated ejection fraction was in the range of 50% to 55%. Wall motion was normal; there were no regional wall motion abnormalities. Doppler parameters are consistent with abnormal left ventricular relaxation (grade 1 diastolic dysfunction). Doppler parameters are consistent with elevated mean left atrial filling pressure. - Mitral valve: Moderately to severely calcified annulus involving the posterior annulus most prominently. Mild regurgitation. - Atrial septum: No defect or patent foramen ovale was identified. - Tricuspid valve: Mild regurgitation. PT/OT and speech therapy consult will also be done=SNF    Hypokalemia -replete  Dementia -at baseline  UTI -rocephin day#2/3  Code Status: full Family Communication: patient/granddaughter/daughter Disposition Plan: SNF   Consultants:  Neuro  PT/OT  Procedures:  MRI  Echo  carotids  Antibiotics:    HPI/Subjective: Sleepy this AM No CP, no SOB  Objective: Filed Vitals:   09/19/13 0940  BP: 119/61  Pulse: 74  Temp: 97.7 F (36.5 C)  Resp: 18    Intake/Output Summary (Last 24 hours) at 09/19/13 1055 Last data filed at 09/18/13 1511  Gross per 24 hour  Intake    350 ml  Output      0 ml  Net    350 ml   Filed Weights   09/18/13 1100  Weight: 52.164 kg (115 lb)    Exam:   General:  Pleasant/cooperative  Cardiovascular: rrr  Respiratory: clear anterior  Abdomen: +BS,  soft, NT  Musculoskeletal: moves all 4 ext   Data Reviewed: Basic Metabolic Panel:  Recent Labs Lab 09/17/13 1952 09/17/13 2010 09/18/13 0755  NA 142 142 138  K 3.6 3.5 3.2*  CL 105 105 103  CO2 23  --  24  GLUCOSE 81 82 78  BUN 24* 23 17  CREATININE 0.77 0.90 0.62  CALCIUM 9.1  --  9.2   Liver Function Tests:  Recent Labs Lab 09/17/13 1952 09/18/13 0755  AST 19 16  ALT 7 6  ALKPHOS 93 94  BILITOT 0.2* 0.3  PROT 6.8 6.9  ALBUMIN 3.4* 3.4*   No results found for this basename: LIPASE, AMYLASE,  in the last 168 hours No results found for this basename: AMMONIA,  in the last 168 hours CBC:  Recent Labs Lab 09/17/13 1952 09/17/13 2010 09/18/13 0755  WBC 4.4  --  3.1*  NEUTROABS 2.8  --  1.7  HGB 11.4* 12.6 12.1  HCT 34.7* 37.0 35.7*  MCV 86.8  --  85.0  PLT 199  --  193   Cardiac Enzymes:  Recent Labs Lab 09/17/13 1952  TROPONINI <0.30   BNP (last 3 results) No results found for this basename: PROBNP,  in the last 8760 hours CBG:  Recent Labs Lab 09/17/13 1948  GLUCAP 94    No results found for this or any previous visit (from the past 240 hour(s)).   Studies: Ct Head Wo Contrast  09/17/2013   CLINICAL DATA:  Right-sided facial droop, drooling, ectasia, right-sided weakness  EXAM: CT HEAD WITHOUT CONTRAST  TECHNIQUE: Contiguous axial images were obtained from the base of  the skull through the vertex without intravenous contrast.  COMPARISON:  04/18/2005  FINDINGS: The examination is degraded due to patient motion artifact necessitating the acquisition of additional images.  There is a rather extensive sulcal prominence and centralized volume loss with commensurate ex vacuo dilatation of the ventricular system, progressed since remote prior examination performed in 2006. There are worsening, now rather extensive periventricular hypodensities compatible with microvascular ischemic disease. Old lacunar infarct within in the left basal ganglia. Given  background parenchymal abnormalities and limitations of the examination, there is no definite CT evidence of acute large territory infarct. No intraparenchymal or extra-axial mass or hemorrhage. Unchanged configuration of the ventricles and basilar cisterns. No midline shift. Intracranial atherosclerosis. There is minimal opacification of the left posterior ethmoidal air cells. The remaining paranasal sinuses and mastoid air cells are normally aerated. Post bilateral cataract surgery. Regional soft tissues are normal. No displaced calvarial fracture.  IMPRESSION: 1. Degraded examination without definite acute intracranial process. 2. Interval progression of now severe atrophy and microvascular ischemic disease.   Electronically Signed   By: Simonne Come M.D.   On: 09/17/2013 21:15   Mri Brain Without Contrast  09/18/2013   CLINICAL DATA:  TIA.  Right-sided weakness with difficulty speaking.  EXAM: MRI HEAD WITHOUT CONTRAST  MRA HEAD WITHOUT CONTRAST  TECHNIQUE: Multiplanar, multiecho pulse sequences of the brain and surrounding structures were obtained without intravenous contrast. Angiographic images of the head were obtained using MRA technique without contrast.  COMPARISON:  CT head 09/17/2013  FINDINGS: MRI HEAD FINDINGS  Image quality degraded by motion.  Advanced atrophy. Moderate chronic microvascular ischemic change throughout the white matter.  Acute infarct in the posterior limb internal capsule left measuring under 1 cm. No other acute infarct.  Negative for hemorrhage or mass.  MRA HEAD FINDINGS  Both vertebral arteries are patent. Mild to moderate stenosis distal left vertebral artery. The basilar is widely patent. Superior cerebellar and posterior cerebral arteries are patent bilaterally.  The internal carotid artery is widely patent bilaterally without significant stenosis. Anterior and middle cerebral arteries are patent bilaterally. Mild irregularity in the middle cerebral artery in the M1  segment and branches consistent with chronic atherosclerotic disease. The anterior branch of the left middle cerebral artery contains a severe stenosis and is poorly visualized but appears to be patent.  IMPRESSION: Subcentimeter acute infarct posterior limb internal capsule on the left.  Advanced atrophy and moderate chronic microvascular ischemic change in the white matter.  Mild to moderate intracranial atherosclerotic disease. Severe stenosis in the anterior temporal branch of the left middle cerebral artery which is poorly visualized.   Electronically Signed   By: Marlan Palau M.D.   On: 09/18/2013 11:29   Mr Maxine Glenn Head/brain Wo Cm  09/18/2013   CLINICAL DATA:  TIA.  Right-sided weakness with difficulty speaking.  EXAM: MRI HEAD WITHOUT CONTRAST  MRA HEAD WITHOUT CONTRAST  TECHNIQUE: Multiplanar, multiecho pulse sequences of the brain and surrounding structures were obtained without intravenous contrast. Angiographic images of the head were obtained using MRA technique without contrast.  COMPARISON:  CT head 09/17/2013  FINDINGS: MRI HEAD FINDINGS  Image quality degraded by motion.  Advanced atrophy. Moderate chronic microvascular ischemic change throughout the white matter.  Acute infarct in the posterior limb internal capsule left measuring under 1 cm. No other acute infarct.  Negative for hemorrhage or mass.  MRA HEAD FINDINGS  Both vertebral arteries are patent. Mild to moderate stenosis distal left vertebral artery. The basilar is widely  patent. Superior cerebellar and posterior cerebral arteries are patent bilaterally.  The internal carotid artery is widely patent bilaterally without significant stenosis. Anterior and middle cerebral arteries are patent bilaterally. Mild irregularity in the middle cerebral artery in the M1 segment and branches consistent with chronic atherosclerotic disease. The anterior branch of the left middle cerebral artery contains a severe stenosis and is poorly visualized but  appears to be patent.  IMPRESSION: Subcentimeter acute infarct posterior limb internal capsule on the left.  Advanced atrophy and moderate chronic microvascular ischemic change in the white matter.  Mild to moderate intracranial atherosclerotic disease. Severe stenosis in the anterior temporal branch of the left middle cerebral artery which is poorly visualized.   Electronically Signed   By: Marlan Palau M.D.   On: 09/18/2013 11:29    Scheduled Meds: . amLODipine  10 mg Oral Daily  . aspirin EC  325 mg Oral Daily  . cefTRIAXone (ROCEPHIN)  IV  1 g Intravenous Q24H  . heparin  5,000 Units Subcutaneous Q8H  . simvastatin  20 mg Oral q1800   Continuous Infusions:   Principal Problem:   TIA (transient ischemic attack) Active Problems:   Anemia   Hypertension   Right sided weakness   CVA (cerebral infarction)    Time spent: 35 min    VANN, JESSICA  [facility] Pager [phone] If 7PM-7AM, please contact night-coverage at [email or web address], password [facility] 09/19/2013, 10:55 AM  LOS: 2 days

## 2013-09-19 NOTE — Progress Notes (Addendum)
Stroke Team Progress Note  HISTORY Tamara Miller is a 79 y.o. female with a history of htn who presents with right sided weakness and confusion. Family is not present during interview, but apparently she had an episode of right sided weakness with confusion the night prior to admission 09/16/2013, then this morning 09/17/2013 again had another episode and presented to [facility] medical center where she was diagnosed with TIA. The patient was still symptomatic and came to the [facility] ED for further evaluation.  Patient was not a TPA candidate secondary to delay in arrival. She was admitted for further evaluation and treatment.  SUBJECTIVE Four family members are at the bedside. They are discussing discharge disposition. Therapy recommended short term SNF - pt already has home health - dtr lives next door and checks on her frequently.  OBJECTIVE Most recent Vital Signs: Filed Vitals:   09/18/13 2122 09/19/13 0200 09/19/13 0507 09/19/13 0940  BP: 131/76 137/60 156/78 119/61  Pulse: 75 69 75 74  Temp: 98.2 F (36.8 C) 97.7 F (36.5 C) 97.5 F (36.4 C) 97.7 F (36.5 C)  TempSrc: Oral Oral Oral Oral  Resp: 16 16 18 18   Height:      Weight:      SpO2: 98% 99% 97% 99%   CBG (last 3)   Recent Labs  09/17/13 1948  GLUCAP 94    IV Fluid Intake:     MEDICATIONS  . amLODipine  10 mg Oral Daily  . aspirin EC  325 mg Oral Daily  . cefTRIAXone (ROCEPHIN)  IV  1 g Intravenous Q24H  . heparin  5,000 Units Subcutaneous Q8H  . simvastatin  20 mg Oral q1800   PRN:    Diet:  Cardiac thin liquids Activity:   Bathroom privileges with assistance DVT Prophylaxis:  Heparin 5000 units sq tid   CLINICALLY SIGNIFICANT STUDIES Basic Metabolic Panel:   Recent Labs Lab 09/17/13 1952 09/17/13 2010 09/18/13 0755  NA 142 142 138  K 3.6 3.5 3.2*  CL 105 105 103  CO2 23  --  24  GLUCOSE 81 82 78  BUN 24* 23 17  CREATININE 0.77 0.90 0.62  CALCIUM 9.1  --  9.2   Liver Function  Tests:   Recent Labs Lab 09/17/13 1952 09/18/13 0755  AST 19 16  ALT 7 6  ALKPHOS 93 94  BILITOT 0.2* 0.3  PROT 6.8 6.9  ALBUMIN 3.4* 3.4*   CBC:   Recent Labs Lab 09/17/13 1952 09/17/13 2010 09/18/13 0755  WBC 4.4  --  3.1*  NEUTROABS 2.8  --  1.7  HGB 11.4* 12.6 12.1  HCT 34.7* 37.0 35.7*  MCV 86.8  --  85.0  PLT 199  --  193   Coagulation:   Recent Labs Lab 09/17/13 1952 09/18/13 0755  LABPROT 12.6 12.6  INR 0.96 0.96   Cardiac Enzymes:   Recent Labs Lab 09/17/13 1952  TROPONINI <0.30   Urinalysis:   Recent Labs Lab 09/18/13 1155  COLORURINE YELLOW  LABSPEC 1.013  PHURINE 7.0  GLUCOSEU NEGATIVE  HGBUR SMALL*  BILIRUBINUR NEGATIVE  KETONESUR NEGATIVE  PROTEINUR NEGATIVE  UROBILINOGEN 0.2  NITRITE POSITIVE*  LEUKOCYTESUR MODERATE*   Lipid Panel    Component Value Date/Time   CHOL 226* 09/18/2013 0427   TRIG 59 09/18/2013 0427   HDL 84 09/18/2013 0427   CHOLHDL 2.7 09/18/2013 0427   VLDL 12 09/18/2013 0427   LDLCALC 130* 09/18/2013 0427   HgbA1C  Lab Results  Component  Value Date   HGBA1C 5.9* 09/18/2013    Urine Drug Screen:      Component Value Date/Time   LABOPIA NONE DETECTED 09/18/2013 1155    Alcohol Level:   Recent Labs Lab 09/17/13 1952  ETH <11    CT of the brain  09/17/2013   1. Degraded examination without definite acute intracranial process. 2. Interval progression of now severe atrophy and microvascular ischemic disease.   MRI of the brain  09/18/2013   Subcentimeter acute infarct posterior limb internal capsule on the left.  Advanced atrophy and moderate chronic microvascular ischemic change in the white matter.    MRA of the brain   09/18/2013     Mild to moderate intracranial atherosclerotic disease. Severe stenosis in the anterior temporal branch of the left middle cerebral artery which is poorly visualized.    2D Echocardiogram  EF 50-55% with no source of embolus.   Carotid Doppler  Bilateral: 1-39%  ICA stenosis. Vertebral artery flow is antegrade.   CXR    EKG  normal sinus rhythm.   Therapy Recommendations HH PT  Physical Exam   Frail elderly african american lady not in distress.Awake alert. Afebrile. Head is nontraumatic. Neck is supple without bruit. Hearing is diminishedl. Cardiac exam no murmur or gallop. Lungs are clear to auscultation. Distal pulses are well felt. Neurological Exam ;  Awake  Alert oriented x 2.Mildly dysarthric speech and normal language.eye movements full without nystagmus.fundi were not visualized. Vision acuity and fields appear normal. Hearing is diminished bilaterally. Palatal movements are normal. Face asymmetric with mild left lower face weak. Tongue midline. Normal strength, tone, reflexes and coordination. Normal sensation. Gait deferred.  ASSESSMENT Tamara Miller is a 79 y.o. female presenting with right sided weakness and confusion. Imaging  shows a small left basal ganglia subcortical infarct. Infarct felt to be thrombotic secondary to small vessel disease .  On aspirin 81 mg orally every day prior to admission. Now on aspirin 325 mg orally every day for secondary stroke prevention. Patient with mild resultant right hemiparesis, improving. Work up underway.  Hypertension Hyperlipidemia, LDL 130, on no statin PTA, now on no statin, goal LDL < 100  Arthritis UTI, on rocephin  Hospital day # 2  TREATMENT/PLAN  Continue aspirin 325 mg orally every day for secondary stroke prevention.  Rehab consult to see if CIR an option instead of short term SNF. Family is working towards 24h care.  No further stroke workup indicated.  Patient has a 10-15% risk of having another stroke over the next year, the highest risk is within 2 weeks of the most recent stroke/TIA (risk of having a stroke following a stroke or TIA is the same).  Ongoing risk factor control by Primary Care Physician  Stroke Service will sign off. Please call should any needs  arise.  Follow up with Dr. Pearlean Brownie, Stroke Clinic, in 2 months.   Annie Main, MSN, RN, ANVP-BC, ANP-BC, [facility] Pager: [phone] 09/19/2013 10:47 AM  I have personally obtained a history, examined the patient, evaluated imaging results, and formulated the assessment and plan of care. I agree with the above.  Delia Heady, MD

## 2013-09-20 LAB — URINE CULTURE: Colony Count: >=100000

## 2013-09-20 MED ORDER — AMLODIPINE BESYLATE 10 MG PO TABS: 5.0000 mg | ORAL_TABLET | Freq: Every day | ORAL | Status: DC

## 2013-09-20 MED ORDER — BISACODYL 10 MG RE SUPP
10.0000 mg | Freq: Every day | RECTAL | Status: DC | PRN
  Administered 2013-09-20: 10 mg via RECTAL
  Filled 2013-09-20: qty 1

## 2013-09-20 MED ORDER — ASPIRIN 325 MG PO TBEC: 325.0000 mg | DELAYED_RELEASE_TABLET | Freq: Every day | ORAL | Status: AC

## 2013-09-20 MED ORDER — SIMVASTATIN 20 MG PO TABS: 20.0000 mg | ORAL_TABLET | Freq: Every day | ORAL | Status: DC

## 2013-09-20 NOTE — Discharge Summary (Addendum)
Physician Discharge Summary  Tamara Miller MWU:[id number] DOB: September 03, 1910 DOA: 09/17/2013  PCP: [name], MD  Admit date: 09/17/2013 Discharge date: 09/21/2013  Time spent: 35 minutes  Recommendations for Outpatient Follow-up:  1. SNF 2. Follow up with Dr Pearlean Brownie in 2 monhts  Discharge Diagnoses:  Principal Problem: CVA  Active Problems:   Anemia   Hypertension   Right sided weakness     Discharge Condition: improved  Diet recommendation: heart healthy  Filed Weights   09/18/13 1100  Weight: 52.164 kg (115 lb)    History of present illness:  Tamara Miller is a 79 y.o. female with Past medical history of arthritis hypertension.  The patient is coming from home.  The patient was brought in by her daughter. She mentions that since yesterday at 5 PM she has been noticing speech difficulty in the patient. She also noted is that the patient was having weakness on her right side with movement. Her symptoms improved overnight and therefore she did not to go to the ER. When she woke up this morning she started having similar symptoms again and therefore she took her to the [facility] where she underwent a CT scan of the head which was not showing any acute abnormality and she was told that she has TIA and evening she has any stroke she would not have any significant workup that due to her age and was given option of observation in the hospital versus taking the patient home. The patient was taken to home since her symptoms are resolved. She had another episode in the home and therefore she was brought to the hospital in the afternoon. At present she denies any complaints of right-sided weakness but she continues to have speech difficulty as per the daughter.  The daughter denies any complaints of fever, chills, nausea, vomiting, abdominal pain, diarrhea, poor appetite, leg swelling, orthopnea, PND or trauma or fall    Hospital Course:  CVA CVA  Head  CT was negative  MRI of the brain showed acute 1 centimeter infarct in the posterior limb of internal capsule.  2-D echo showed EF of 50-55% with grade 1 diastolic dysfunction.  carotid Doppler showed bilateral 1-39% ICA stenosis.  Appreciate neurology recommendation. She has been placed on a full dose aspirin. Continue Zocor.  She does not have further neurological weakness. stable to be discharged back to skilled nursing facility.   UTI  Urine culture growing Escherichia coli. Treated with 3 days of Rocephin. Asymptomatic   Hypertension  Stable. Continue amlodipine   Hypokalemia Replenished  Code Status: Full code  Family Communication: discussed with granddaughter at bedside  Disposition Plan: d/c to SNF. F/up with Dr Pearlean Brownie in 2 months         Procedures:  Echo  Carotids    Consultations:  neuro  Discharge Exam: Filed Vitals:   09/20/13 0958  BP: 102/57  Pulse: 86  Temp: 97.4 F (36.3 C)  Resp: 20    General: Elderly female in no acute distress  HEENT: No pallor, moist oral mucosa  Chest: Clear to auscultation bilaterally, no added sounds  CVS: Normal S1 and S2, no murmurs rub or gallop  Extremities: Warm, no edema  CNS: AAO x3, hard of hearing, no focal neurological deficit   Discharge Instructions      Discharge Orders   Future Orders Complete By Expires   Diet - low sodium heart healthy  As directed    Increase activity slowly  As directed  Medication List         amLODipine 10 MG tablet  Commonly known as:  NORVASC  Take 0.5 tablets (5 mg total) by mouth daily.     aspirin 325 MG EC tablet  Take 1 tablet (325 mg total) by mouth daily.     simvastatin 20 MG tablet  Commonly known as:  ZOCOR  Take 1 tablet (20 mg total) by mouth daily at 6 PM.       No Known Allergies Follow-up Information   Follow up with Gates Rigg, MD. Schedule an appointment as soon as possible for a visit in 2 months. (stroke clinic)     Specialties:  Neurology, Radiology   Contact information:   [address] [place] [address] [phone]        The results of significant diagnostics from this hospitalization (including imaging, microbiology, ancillary and laboratory) are listed below for reference.    Significant Diagnostic Studies: Ct Head Wo Contrast  09/17/2013   CLINICAL DATA:  Right-sided facial droop, drooling, ectasia, right-sided weakness  EXAM: CT HEAD WITHOUT CONTRAST  TECHNIQUE: Contiguous axial images were obtained from the base of the skull through the vertex without intravenous contrast.  COMPARISON:  04/18/2005  FINDINGS: The examination is degraded due to patient motion artifact necessitating the acquisition of additional images.  There is a rather extensive sulcal prominence and centralized volume loss with commensurate ex vacuo dilatation of the ventricular system, progressed since remote prior examination performed in 2006. There are worsening, now rather extensive periventricular hypodensities compatible with microvascular ischemic disease. Old lacunar infarct within in the left basal ganglia. Given background parenchymal abnormalities and limitations of the examination, there is no definite CT evidence of acute large territory infarct. No intraparenchymal or extra-axial mass or hemorrhage. Unchanged configuration of the ventricles and basilar cisterns. No midline shift. Intracranial atherosclerosis. There is minimal opacification of the left posterior ethmoidal air cells. The remaining paranasal sinuses and mastoid air cells are normally aerated. Post bilateral cataract surgery. Regional soft tissues are normal. No displaced calvarial fracture.  IMPRESSION: 1. Degraded examination without definite acute intracranial process. 2. Interval progression of now severe atrophy and microvascular ischemic disease.   Electronically Signed   By: Simonne Come M.D.   On: 09/17/2013 21:15   Mri Brain Without  Contrast  09/18/2013   CLINICAL DATA:  TIA.  Right-sided weakness with difficulty speaking.  EXAM: MRI HEAD WITHOUT CONTRAST  MRA HEAD WITHOUT CONTRAST  TECHNIQUE: Multiplanar, multiecho pulse sequences of the brain and surrounding structures were obtained without intravenous contrast. Angiographic images of the head were obtained using MRA technique without contrast.  COMPARISON:  CT head 09/17/2013  FINDINGS: MRI HEAD FINDINGS  Image quality degraded by motion.  Advanced atrophy. Moderate chronic microvascular ischemic change throughout the white matter.  Acute infarct in the posterior limb internal capsule left measuring under 1 cm. No other acute infarct.  Negative for hemorrhage or mass.  MRA HEAD FINDINGS  Both vertebral arteries are patent. Mild to moderate stenosis distal left vertebral artery. The basilar is widely patent. Superior cerebellar and posterior cerebral arteries are patent bilaterally.  The internal carotid artery is widely patent bilaterally without significant stenosis. Anterior and middle cerebral arteries are patent bilaterally. Mild irregularity in the middle cerebral artery in the M1 segment and branches consistent with chronic atherosclerotic disease. The anterior branch of the left middle cerebral artery contains a severe stenosis and is poorly visualized but appears to be patent.  IMPRESSION:  Subcentimeter acute infarct posterior limb internal capsule on the left.  Advanced atrophy and moderate chronic microvascular ischemic change in the white matter.  Mild to moderate intracranial atherosclerotic disease. Severe stenosis in the anterior temporal branch of the left middle cerebral artery which is poorly visualized.   Electronically Signed   By: Marlan Palau M.D.   On: 09/18/2013 11:29   Mr Maxine Glenn Head/brain Wo Cm  09/18/2013   CLINICAL DATA:  TIA.  Right-sided weakness with difficulty speaking.  EXAM: MRI HEAD WITHOUT CONTRAST  MRA HEAD WITHOUT CONTRAST  TECHNIQUE: Multiplanar,  multiecho pulse sequences of the brain and surrounding structures were obtained without intravenous contrast. Angiographic images of the head were obtained using MRA technique without contrast.  COMPARISON:  CT head 09/17/2013  FINDINGS: MRI HEAD FINDINGS  Image quality degraded by motion.  Advanced atrophy. Moderate chronic microvascular ischemic change throughout the white matter.  Acute infarct in the posterior limb internal capsule left measuring under 1 cm. No other acute infarct.  Negative for hemorrhage or mass.  MRA HEAD FINDINGS  Both vertebral arteries are patent. Mild to moderate stenosis distal left vertebral artery. The basilar is widely patent. Superior cerebellar and posterior cerebral arteries are patent bilaterally.  The internal carotid artery is widely patent bilaterally without significant stenosis. Anterior and middle cerebral arteries are patent bilaterally. Mild irregularity in the middle cerebral artery in the M1 segment and branches consistent with chronic atherosclerotic disease. The anterior branch of the left middle cerebral artery contains a severe stenosis and is poorly visualized but appears to be patent.  IMPRESSION: Subcentimeter acute infarct posterior limb internal capsule on the left.  Advanced atrophy and moderate chronic microvascular ischemic change in the white matter.  Mild to moderate intracranial atherosclerotic disease. Severe stenosis in the anterior temporal branch of the left middle cerebral artery which is poorly visualized.   Electronically Signed   By: Marlan Palau M.D.   On: 09/18/2013 11:29    Microbiology: Recent Results (from the past 240 hour(s))  URINE CULTURE     Status: None   Collection Time    09/18/13 11:55 AM      Result Value Range Status   Specimen Description URINE, CATHETERIZED   Final   Special Requests NONE   Final   Culture  Setup Time     Final   Value: 09/18/2013 14:24     Performed at [facility] Count     Final    Value: >=100,000 COLONIES/ML     Performed at [facility]   Culture     Final   Value: ESCHERICHIA COLI     Performed at [facility]   Report Status 09/20/2013 FINAL   Final   Organism ID, Bacteria ESCHERICHIA COLI   Final     Labs: Basic Metabolic Panel:  Recent Labs Lab 09/17/13 1952 09/17/13 2010 09/18/13 0755  NA 142 142 138  K 3.6 3.5 3.2*  CL 105 105 103  CO2 23  --  24  GLUCOSE 81 82 78  BUN 24* 23 17  CREATININE 0.77 0.90 0.62  CALCIUM 9.1  --  9.2   Liver Function Tests:  Recent Labs Lab 09/17/13 1952 09/18/13 0755  AST 19 16  ALT 7 6  ALKPHOS 93 94  BILITOT 0.2* 0.3  PROT 6.8 6.9  ALBUMIN 3.4* 3.4*   No results found for this basename: LIPASE, AMYLASE,  in the last 168 hours No results found for  this basename: AMMONIA,  in the last 168 hours CBC:  Recent Labs Lab 09/17/13 1952 09/17/13 2010 09/18/13 0755  WBC 4.4  --  3.1*  NEUTROABS 2.8  --  1.7  HGB 11.4* 12.6 12.1  HCT 34.7* 37.0 35.7*  MCV 86.8  --  85.0  PLT 199  --  193   Cardiac Enzymes:  Recent Labs Lab 09/17/13 1952  TROPONINI <0.30   BNP: BNP (last 3 results) No results found for this basename: PROBNP,  in the last 8760 hours CBG:  Recent Labs Lab 09/17/13 1948  GLUCAP 94       Signed:  VANN, JESSICA  [facility] 09/20/2013, 12:38 PM

## 2013-09-20 NOTE — Progress Notes (Signed)
PT Cancellation Note  Patient Details Name: Tamara Miller MRN: [id number] DOB: April 08, 1910   Cancelled Treatment:    Reason Eval/Treat Not Completed: Fatigue/lethargy limiting ability to participate.  Pt states too tired this am.  Will try another time.     Sunny Schlein, [place] [phone] 09/20/2013, 12:07 PM

## 2013-09-20 NOTE — Progress Notes (Signed)
Talked to patient's daughter about DCP; patient is to go to SNF short term for rehab and the facility of choice is [facility] in [place]; Soc Worker made aware; Abelino Derrick RN,BSN,MHA [phone]

## 2013-09-20 NOTE — Clinical Social Work Note (Signed)
Patient has been faxed out. CSW to follow up on 09/21/13.  Roddie Mc, [place], [place], [phone]

## 2013-09-21 DIAGNOSIS — E876 Hypokalemia: Secondary | ICD-10-CM

## 2013-09-21 DIAGNOSIS — N390 Urinary tract infection, site not specified: Secondary | ICD-10-CM

## 2013-09-21 DIAGNOSIS — R5381 Other malaise: Secondary | ICD-10-CM

## 2013-09-21 NOTE — Progress Notes (Signed)
Physical Therapy Treatment Patient Details Name: Tamara Miller MRN: [id number] DOB: 1910-04-12 Today's Date: 09/21/2013 Time: 4098-1191 PT Time Calculation (min): 24 min  PT Assessment / Plan / Recommendation  History of Present Illness 79 yo female admitted with sudden onset slurred speech and R sided weakness. MRI (+) posterior internal capsule infarct     PT Comments   Pt agreeable to mobility today, but during mobility stating she feels weak.  Per family anticipate D/C to SNF later today.    Follow Up Recommendations  SNF     Does the patient have the potential to tolerate intense rehabilitation     Barriers to Discharge        Equipment Recommendations  Wheelchair (measurements PT);Wheelchair cushion (measurements PT)    Recommendations for Other Services    Frequency Min 4X/week   Progress towards PT Goals Progress towards PT goals: Progressing toward goals  Plan Current plan remains appropriate    Precautions / Restrictions Precautions Precautions: Fall Precaution Comments: pt with urinary incontinence.  Family to bring in briefs.   Restrictions Weight Bearing Restrictions: No   Pertinent Vitals/Pain Denied pain.      Mobility  Bed Mobility Bed Mobility: Supine to Sit;Sitting - Scoot to Edge of Bed Supine to Sit: 3: Mod assist;HOB elevated Sitting - Scoot to [place] of Bed: 2: Max assist Details for Bed Mobility Assistance: pt attempting to sit up on her own and needed A to bring LE OOB and for bringing trunk up to sitting.   Transfers Transfers: Sit to Stand;Stand to Sit;Stand Pivot Transfers Sit to Stand: 1: +2 Total assist;With upper extremity assist;From bed Sit to Stand: Patient Percentage: 50% Stand to Sit: 1: +2 Total assist;With upper extremity assist;To bed;To chair/3-in-1 Stand to Sit: Patient Percentage: 40% Stand Pivot Transfers: 1: +2 Total assist Stand Pivot Transfers: Patient Percentage: 40% Details for Transfer Assistance: pt stating she  feels weak today, but wanting to try to get up.   Ambulation/Gait Ambulation/Gait Assistance: Not tested (comment) Stairs: No Wheelchair Mobility Wheelchair Mobility: No Modified Rankin (Stroke Patients Only) Pre-Morbid Rankin Score: Moderate disability Modified Rankin: Severe disability    Exercises     PT Diagnosis:    PT Problem List:   PT Treatment Interventions:     PT Goals (current goals can now be found in the care plan section) Acute Rehab PT Goals Patient Stated Goal: None stated Time For Goal Achievement: 10/02/13 Potential to Achieve Goals: Fair  Visit Information  Last PT Received On: 09/21/13 Assistance Needed: +2 (ambulation) History of Present Illness: 79 yo female admitted with sudden onset slurred speech and R sided weakness. MRI (+) posterior internal capsule infarct      Subjective Data  Patient Stated Goal: None stated   Cognition  Cognition Arousal/Alertness:  (Drowsy, but arousable.  ) Behavior During Therapy: Flat affect Overall Cognitive Status: Impaired/Different from baseline Area of Impairment: Attention;Memory;Awareness Current Attention Level: Focused Memory: Decreased short-term memory Awareness: Anticipatory;Emergent    Balance  Balance Balance Assessed: No  End of Session PT - End of Session Equipment Utilized During Treatment: Gait belt Activity Tolerance: Patient limited by fatigue Patient left: in chair;with call bell/phone within reach;with family/visitor present Nurse Communication: Mobility status   GP     Sunny Schlein, [place] [phone] 09/21/2013, 10:31 AM

## 2013-09-21 NOTE — Progress Notes (Signed)
[facility] PROGRESS NOTE  Tamara Miller OZH:[id number] DOB: [birth year] DOA: 09/17/2013 PCP: [name], MD   Please refer to discharge summary from 12/18 for details   Assessment/Plan: CVA Head CT was negative MRI of the brain showed acute 1 centimeter infarct in the posterior limb of internal capsule. 2-D echo showed EF of 50-55% with grade 1 diastolic dysfunction. carotid Doppler showed bilateral 1-39% ICA stenosis. Appreciate neurology recommendation. She has been placed on a full dose aspirin. Continue Zocor. She  does not have further neurological weakness.  stable to be discharged back to skilled nursing facility.   UTI Urine culture growing Escherichia coli. Treated with 3 days of Rocephin. Asymptomatic  Hypertension Stable. Continue amlodipine  Code Status: Full code Family Communication: discussed with granddaughter at bedside Disposition Plan: d/c to SNF. F/up with Dr Pearlean Brownie in 2 months   Consultants:  NEUROLOGY  Procedures:  2d echo  Antibiotics:  Rocephin ( completed on 12/18)  HPI/Subjective: No overnight issues  Objective: Filed Vitals:   09/21/13 1022  BP: 99/67  Pulse: 88  Temp: 98.2 F (36.8 C)  Resp: 18    Intake/Output Summary (Last 24 hours) at 09/21/13 1138 Last data filed at 09/21/13 0835  Gross per 24 hour  Intake    240 ml  Output      0 ml  Net    240 ml   Filed Weights   09/18/13 1100  Weight: 52.164 kg (115 lb)    Exam:   General:  Elderly female in no acute distress  HEENT: No pallor, moist oral mucosa  Chest: Clear to auscultation bilaterally, no added sounds  CVS: Normal S1 and S2, no murmurs rub or gallop  Extremities: Warm, no edema  CNS: AAO x3, hard of hearing, no focal neurological deficit  Data Reviewed: Basic Metabolic Panel:  Recent Labs Lab 09/17/13 1952 09/17/13 2010 09/18/13 0755  NA 142 142 138  K 3.6 3.5 3.2*  CL 105 105 103  CO2 23  --  24  GLUCOSE 81 82 78  BUN  24* 23 17  CREATININE 0.77 0.90 0.62  CALCIUM 9.1  --  9.2   Liver Function Tests:  Recent Labs Lab 09/17/13 1952 09/18/13 0755  AST 19 16  ALT 7 6  ALKPHOS 93 94  BILITOT 0.2* 0.3  PROT 6.8 6.9  ALBUMIN 3.4* 3.4*   No results found for this basename: LIPASE, AMYLASE,  in the last 168 hours No results found for this basename: AMMONIA,  in the last 168 hours CBC:  Recent Labs Lab 09/17/13 1952 09/17/13 2010 09/18/13 0755  WBC 4.4  --  3.1*  NEUTROABS 2.8  --  1.7  HGB 11.4* 12.6 12.1  HCT 34.7* 37.0 35.7*  MCV 86.8  --  85.0  PLT 199  --  193   Cardiac Enzymes:  Recent Labs Lab 09/17/13 1952  TROPONINI <0.30   BNP (last 3 results) No results found for this basename: PROBNP,  in the last 8760 hours CBG:  Recent Labs Lab 09/17/13 1948  GLUCAP 94    Recent Results (from the past 240 hour(s))  URINE CULTURE     Status: None   Collection Time    09/18/13 11:55 AM      Result Value Range Status   Specimen Description URINE, CATHETERIZED   Final   Special Requests NONE   Final   Culture  Setup Time     Final   Value: 09/18/2013 14:24  Performed at [facility] Count     Final   Value: >=100,000 COLONIES/ML     Performed at [facility]   Culture     Final   Value: ESCHERICHIA COLI     Performed at [facility]   Report Status 09/20/2013 FINAL   Final   Organism ID, Bacteria ESCHERICHIA COLI   Final     Studies: No results found.  Scheduled Meds: . amLODipine  10 mg Oral Daily  . aspirin EC  325 mg Oral Daily  . cefTRIAXone (ROCEPHIN)  IV  1 g Intravenous Q24H  . heparin  5,000 Units Subcutaneous Q8H  . simvastatin  20 mg Oral q1800   Continuous Infusions:     Time spent: A 5 minutes   Cherl Gorney  [facility] Pager [phone] If 7PM-7AM, please contact night-coverage at [email or web address], password [facility] 09/21/2013, 11:38 AM  LOS: 4 days

## 2013-09-21 NOTE — Clinical Social Work Placement (Signed)
Clinical Social Work Department CLINICAL SOCIAL WORK PLACEMENT NOTE 09/21/2013  Patient:  Tamara Miller, Tamara Miller  Account Number:  [id number] Admit date:  09/17/2013  Clinical Social Worker:  Cherre Blanc, [place]  Date/time:  09/21/2013 04:00 PM  Clinical Social Work is seeking post-discharge placement for this patient at the following level of care:   SKILLED NURSING   (*CSW will update this form in Epic as items are completed)   09/21/2013  Patient/family provided with [facility] Department of Clinical Social Work's list of facilities offering this level of care within the geographic area requested by the patient (or if unable, by the patient's family).  09/21/2013  Patient/family informed of their freedom to choose among providers that offer the needed level of care, that participate in Medicare, Medicaid or managed care program needed by the patient, have an available bed and are willing to accept the patient.  09/21/2013  Patient/family informed of [facility]' ownership interest in [facility], as well as of the fact that they are under no obligation to receive care at this facility.  PASARR submitted to EDS on 09/21/2013 PASARR number received from EDS on 09/21/2013  FL2 transmitted to all facilities in geographic area requested by pt/family on  09/21/2013 FL2 transmitted to all facilities within larger geographic area on   Patient informed that his/her managed care company has contracts with or will negotiate with  certain facilities, including the following:     Patient/family informed of bed offers received:  09/21/2013 Patient chooses bed at [place], [facility] Physician recommends and patient chooses bed at    Patient to be transferred to [place], [facility] on  09/21/2013 Patient to be transferred to facility by Ambulance.  The following physician request were entered in Epic:   Additional Comments: Per MD patient ready to DC  to [facility] 09/21/13. RN, daughter, and facility notified of DC. RN given number for report. Ambulance transport requested for patient. DC packet left on chart. Faxed patient's ID to [facility] to correct wrong birthdate in [facility]. Daughter completed paperwork at facility. CSW signing off.   Roddie Mc, [place], [place], [phone]

## 2013-09-21 NOTE — Clinical Social Work Psychosocial (Signed)
Clinical Social Work Department BRIEF PSYCHOSOCIAL ASSESSMENT 09/21/2013  Patient:  JAYLENNE, HAMELIN     Account Number:  [id number]     Admit date:  09/17/2013  Clinical Social Worker:  Lavell Luster  Date/Time:  09/20/2013 03:30 PM  Referred by:  Physician  Date Referred:  09/20/2013 Referred for  SNF Placement   Other Referral:   Interview type:  Other - See comment Other interview type:   Daughter interviewed at bedside as patient is not oriented.    PSYCHOSOCIAL DATA Living Status:  FAMILY Admitted from facility:   Level of care:   Primary support name:  Nicole Cella Primary support relationship to patient:  CHILD, ADULT Degree of support available:   Support is strong.    CURRENT CONCERNS Current Concerns  Post-Acute Placement   Other Concerns:    SOCIAL WORK ASSESSMENT / PLAN CSW spoke with patient's daughter at bedside about recommendation for SNF placement. Daughter is agreeable to SNF placement and would like patient to go to [facility]. Daughter plans for patient to return home after SNF stay. Daughter does not report any previous SNF stay. CSW will continue to assist in DC.   Assessment/plan status:  Psychosocial Support/Ongoing Assessment of Needs Other assessment/ plan:   Complete FL2, Fax, PASRR   Information/referral to community resources:   CSW contact information and SNF list given to daughter.    PATIENT'S/FAMILY'S RESPONSE TO PLAN OF CARE: Daughter plans for patient to DC to SNF when stable. Daughter was pleasant, appropriate, and appreciative of CSW contact. Daughter awaits bed offers. CSW will follow.       Roddie Mc, [place], [place], [phone]

## 2013-09-21 NOTE — Progress Notes (Signed)
Report given to Joy(RN) in [place]. Patient still here awaiting on PTAR.

## 2013-09-22 NOTE — ED Provider Notes (Signed)
Medical screening examination/treatment/procedure(s) were conducted as a shared visit with non-physician practitioner(s) and myself.  I personally evaluated the patient during the encounter.  EKG Interpretation    Date/Time:  Monday September 17 2013 17:24:41 EST Ventricular Rate:  82 PR Interval:  155 QRS Duration: 119 QT Interval:  400 QTC Calculation: 467 R Axis:   -72 Text Interpretation:  Sinus rhythm Incomplete RBBB  Artifact i Confirmed by Fayrene Fearing  MD, Jaidyn Usery ([id number]) on 09/17/2013 11:07:59 PM            Patient evaluated. Has new neurological findings. Patient's time of presentation beyond time on for acute lytic therapy. Being seen and evaluated by neurology and admission plans made.  Roney Marion, MD 09/22/13 [phone]

## 2013-10-21 ENCOUNTER — Encounter (HOSPITAL_COMMUNITY): Payer: Self-pay | Admitting: Emergency Medicine

## 2013-10-21 ENCOUNTER — Emergency Department (HOSPITAL_COMMUNITY): Payer: Medicare Other

## 2013-10-21 ENCOUNTER — Inpatient Hospital Stay (HOSPITAL_COMMUNITY)
Admission: EM | Admit: 2013-10-21 | Discharge: 2013-10-24 | DRG: 555 | Disposition: A | Discharge status: Hospice | Payer: Medicare Other | Attending: Family Medicine | Admitting: Family Medicine

## 2013-10-21 DIAGNOSIS — M1990 Unspecified osteoarthritis, unspecified site: Secondary | ICD-10-CM | POA: Diagnosis present

## 2013-10-21 DIAGNOSIS — I639 Cerebral infarction, unspecified: Secondary | ICD-10-CM | POA: Diagnosis present

## 2013-10-21 DIAGNOSIS — M25559 Pain in unspecified hip: Principal | ICD-10-CM | POA: Diagnosis present

## 2013-10-21 DIAGNOSIS — R627 Adult failure to thrive: Secondary | ICD-10-CM | POA: Diagnosis present

## 2013-10-21 DIAGNOSIS — M25552 Pain in left hip: Secondary | ICD-10-CM | POA: Diagnosis present

## 2013-10-21 DIAGNOSIS — Z8249 Family history of ischemic heart disease and other diseases of the circulatory system: Secondary | ICD-10-CM

## 2013-10-21 DIAGNOSIS — IMO0002 Reserved for concepts with insufficient information to code with codable children: Secondary | ICD-10-CM

## 2013-10-21 DIAGNOSIS — Z66 Do not resuscitate: Secondary | ICD-10-CM | POA: Diagnosis present

## 2013-10-21 DIAGNOSIS — R531 Weakness: Secondary | ICD-10-CM

## 2013-10-21 DIAGNOSIS — R5381 Other malaise: Secondary | ICD-10-CM | POA: Diagnosis present

## 2013-10-21 DIAGNOSIS — Z79899 Other long term (current) drug therapy: Secondary | ICD-10-CM

## 2013-10-21 DIAGNOSIS — W050XXA Fall from non-moving wheelchair, initial encounter: Secondary | ICD-10-CM | POA: Diagnosis present

## 2013-10-21 DIAGNOSIS — Z7982 Long term (current) use of aspirin: Secondary | ICD-10-CM

## 2013-10-21 DIAGNOSIS — I10 Essential (primary) hypertension: Secondary | ICD-10-CM | POA: Diagnosis present

## 2013-10-21 DIAGNOSIS — R6251 Failure to thrive (child): Secondary | ICD-10-CM | POA: Diagnosis present

## 2013-10-21 DIAGNOSIS — I6350 Cerebral infarction due to unspecified occlusion or stenosis of unspecified cerebral artery: Secondary | ICD-10-CM

## 2013-10-21 DIAGNOSIS — I498 Other specified cardiac arrhythmias: Secondary | ICD-10-CM | POA: Diagnosis present

## 2013-10-21 DIAGNOSIS — Z993 Dependence on wheelchair: Secondary | ICD-10-CM

## 2013-10-21 DIAGNOSIS — R5383 Other fatigue: Secondary | ICD-10-CM

## 2013-10-21 DIAGNOSIS — E43 Unspecified severe protein-calorie malnutrition: Secondary | ICD-10-CM | POA: Diagnosis present

## 2013-10-21 DIAGNOSIS — R52 Pain, unspecified: Secondary | ICD-10-CM

## 2013-10-21 DIAGNOSIS — D649 Anemia, unspecified: Secondary | ICD-10-CM | POA: Diagnosis present

## 2013-10-21 DIAGNOSIS — M129 Arthropathy, unspecified: Secondary | ICD-10-CM

## 2013-10-21 DIAGNOSIS — Z515 Encounter for palliative care: Secondary | ICD-10-CM

## 2013-10-21 DIAGNOSIS — I4510 Unspecified right bundle-branch block: Secondary | ICD-10-CM | POA: Diagnosis present

## 2013-10-21 DIAGNOSIS — I69920 Aphasia following unspecified cerebrovascular disease: Secondary | ICD-10-CM

## 2013-10-21 HISTORY — DX: Encephalopathy, unspecified: G93.40

## 2013-10-21 HISTORY — DX: Unspecified right bundle-branch block: I45.10

## 2013-10-21 HISTORY — DX: Unspecified protein-calorie malnutrition: E46

## 2013-10-21 HISTORY — DX: Transient cerebral ischemic attack, unspecified: G45.9

## 2013-10-21 HISTORY — DX: Cerebral infarction, unspecified: I63.9

## 2013-10-21 LAB — CBC WITH DIFFERENTIAL/PLATELET
BASOS PCT: 0 % (ref 0–1)
Basophils Absolute: 0 10*3/uL (ref 0.0–0.1)
EOS ABS: 0 10*3/uL (ref 0.0–0.7)
EOS PCT: 1 % (ref 0–5)
HCT: 36.8 % (ref 36.0–46.0)
Hemoglobin: 12.2 g/dL (ref 12.0–15.0)
Lymphocytes Relative: 13 % (ref 12–46)
Lymphs Abs: 0.9 10*3/uL (ref 0.7–4.0)
MCH: 28.2 pg (ref 26.0–34.0)
MCHC: 33.2 g/dL (ref 30.0–36.0)
MCV: 85.2 fL (ref 78.0–100.0)
Monocytes Absolute: 0.5 10*3/uL (ref 0.1–1.0)
Monocytes Relative: 7 % (ref 3–12)
Neutro Abs: 5.2 10*3/uL (ref 1.7–7.7)
Neutrophils Relative %: 79 % — ABNORMAL HIGH (ref 43–77)
Platelets: 223 10*3/uL (ref 150–400)
RBC: 4.32 MIL/uL (ref 3.87–5.11)
RDW: 14.3 % (ref 11.5–15.5)
WBC: 6.6 10*3/uL (ref 4.0–10.5)

## 2013-10-21 LAB — BASIC METABOLIC PANEL
BUN: 14 mg/dL (ref 6–23)
CALCIUM: 9 mg/dL (ref 8.4–10.5)
CO2: 23 mEq/L (ref 19–32)
Chloride: 102 mEq/L (ref 96–112)
Creatinine, Ser: 0.6 mg/dL (ref 0.50–1.10)
GFR calc Af Amer: 83 mL/min — ABNORMAL LOW (ref >90)
GFR, EST NON AFRICAN AMERICAN: 72 mL/min — AB (ref >90)
GLUCOSE: 103 mg/dL — AB (ref 70–99)
Potassium: 4 mEq/L (ref 3.7–5.3)
Sodium: 143 mEq/L (ref 137–147)

## 2013-10-21 LAB — URINALYSIS W MICROSCOPIC + REFLEX CULTURE
Glucose, UA: NEGATIVE mg/dL
Hgb urine dipstick: NEGATIVE
KETONES UR: 15 mg/dL — AB
LEUKOCYTES UA: NEGATIVE
NITRITE: NEGATIVE
Protein, ur: 30 mg/dL — AB
Specific Gravity, Urine: 1.024 (ref 1.005–1.030)
Urobilinogen, UA: 1 mg/dL (ref 0.0–1.0)
pH: 6.5 (ref 5.0–8.0)

## 2013-10-21 LAB — LACTIC ACID, PLASMA: LACTIC ACID, VENOUS: 1.4 mmol/L (ref 0.5–2.2)

## 2013-10-21 LAB — TROPONIN I

## 2013-10-21 MED ORDER — ASPIRIN EC 325 MG PO TBEC
325.0000 mg | DELAYED_RELEASE_TABLET | Freq: Every day | ORAL | Status: DC
  Administered 2013-10-22 – 2013-10-24 (×3): 325 mg via ORAL
  Filled 2013-10-21 (×3): qty 1

## 2013-10-21 MED ORDER — KCL IN DEXTROSE-NACL 20-5-0.45 MEQ/L-%-% IV SOLN
INTRAVENOUS | Status: DC
  Administered 2013-10-22 (×2): via INTRAVENOUS
  Filled 2013-10-21 (×5): qty 1000

## 2013-10-21 MED ORDER — ONDANSETRON HCL 4 MG/2ML IJ SOLN: 4.0000 mg | Freq: Four times a day (QID) | INTRAMUSCULAR | Status: DC | PRN

## 2013-10-21 MED ORDER — SODIUM CHLORIDE 0.9 % IV SOLN
INTRAVENOUS | Status: DC
  Administered 2013-10-21: 17:00:00 via INTRAVENOUS

## 2013-10-21 MED ORDER — ACETAMINOPHEN 325 MG PO TABS
650.0000 mg | ORAL_TABLET | Freq: Four times a day (QID) | ORAL | Status: DC | PRN
  Administered 2013-10-23: 650 mg via ORAL
  Filled 2013-10-21: qty 2

## 2013-10-21 MED ORDER — HEPARIN SODIUM (PORCINE) 5000 UNIT/ML IJ SOLN
5000.0000 [IU] | Freq: Three times a day (TID) | INTRAMUSCULAR | Status: DC
  Administered 2013-10-22 – 2013-10-23 (×5): 5000 [IU] via SUBCUTANEOUS
  Filled 2013-10-21 (×8): qty 1

## 2013-10-21 MED ORDER — MORPHINE SULFATE 2 MG/ML IJ SOLN
2.0000 mg | INTRAMUSCULAR | Status: DC | PRN
  Administered 2013-10-21: 2 mg via INTRAVENOUS
  Filled 2013-10-21: qty 1

## 2013-10-21 MED ORDER — MORPHINE SULFATE 2 MG/ML IJ SOLN
1.0000 mg | INTRAMUSCULAR | Status: DC | PRN
  Administered 2013-10-23: 1 mg via INTRAVENOUS
  Filled 2013-10-21: qty 1

## 2013-10-21 MED ORDER — ONDANSETRON HCL 4 MG PO TABS: 4.0000 mg | ORAL_TABLET | Freq: Four times a day (QID) | ORAL | Status: DC | PRN

## 2013-10-21 MED ORDER — AMLODIPINE BESYLATE 5 MG PO TABS
5.0000 mg | ORAL_TABLET | Freq: Every day | ORAL | Status: DC
  Administered 2013-10-22 – 2013-10-24 (×3): 5 mg via ORAL
  Filled 2013-10-21 (×3): qty 1

## 2013-10-21 MED ORDER — SODIUM CHLORIDE 0.9 % IV SOLN: INTRAVENOUS | Status: AC

## 2013-10-21 MED ORDER — SIMVASTATIN 20 MG PO TABS
20.0000 mg | ORAL_TABLET | Freq: Every day | ORAL | Status: DC
  Filled 2013-10-21 (×2): qty 1

## 2013-10-21 MED ORDER — OXYCODONE HCL 5 MG PO TABS: 5.0000 mg | ORAL_TABLET | ORAL | Status: DC | PRN

## 2013-10-21 MED ORDER — ACETAMINOPHEN 650 MG RE SUPP: 650.0000 mg | Freq: Four times a day (QID) | RECTAL | Status: DC | PRN

## 2013-10-21 NOTE — ED Notes (Addendum)
Pt return to room, all neccessary monitor applied.

## 2013-10-21 NOTE — H&P (Signed)
[facility] History and Physical  Tamara Miller ZOX:[id number] DOB: March 09, 1910 DOA: 10/21/2013   Referring physician: Dr. Clarene Duke PCP: [name], MD   Chief Complaint: Decrease oral intake, left hip pain  HPI: Tamara Miller is a 80 y.o. female  With history of CVA, hypertension, arthritis. With recent history of a fall approximately 2 weeks ago on her left hip. Since the fall has had discomfort in her left hip which reportedly has not gotten better and has gotten worse. Patient has had limited mobility as a result. Patient had a stroke her approximately 3 weeks ago and has had difficulty ambulating as a result. Family has been using a wheelchair for transportation. Reportedly fell from her wheelchair recently. Family reports the patient has had much discomfort in her left hip and has recently had decreased oral intake of solids and fluids. Given persistence of symptoms family decided to bring patient to the ED for further evaluation.  While in the ED patient had CT of the left hip which showed no evidence of acute left hip fracture or dislocation. Given intractable pain and reports of decreased oral intake we were consult at for further evaluation recommendations   Review of Systems:  Unable to accurately assess due to limited patient cooperation  Past Medical History  Diagnosis Date  . Arthritis   . HTN (hypertension)     Taken off BP meds by PCP when BP normalized  . TIA (transient ischemic attack)   . Encephalopathy   . Stroke     right sided weakness, speech difficulty  . RBBB (right bundle branch block)   . Malnutrition    Past Surgical History  Procedure Laterality Date  . Abdominal hysterectomy     Social History:  reports that she has never smoked. She has never used smokeless tobacco. She reports that she does not drink alcohol or use illicit drugs.  No Known Allergies  Family History  Problem Relation Age of Onset  . Heart disease Father   .  Cancer Brother   . Cancer Brother   . Dementia Son      Prior to Admission medications   Medication Sig Start Date End Date Taking? Authorizing Provider  amLODipine (NORVASC) 10 MG tablet Take 0.5 tablets (5 mg total) by mouth daily. 09/20/13  Yes Joseph Art, DO  aspirin EC 325 MG EC tablet Take 1 tablet (325 mg total) by mouth daily. 09/20/13  Yes Joseph Art, DO  simvastatin (ZOCOR) 20 MG tablet Take 1 tablet (20 mg total) by mouth daily at 6 PM. 09/20/13  Yes Joseph Art, DO   Physical Exam: Filed Vitals:   10/21/13 2000  BP: 168/88  Pulse: 96  Temp:   Resp:     BP 168/88  Pulse 96  Temp(Src) 98.8 F (37.1 C) (Oral)  Resp 14  SpO2 94%  General: Somnolent, arousable on command.  Eyes: PERRL, normal lids, irises & conjunctiva ENT: grossly normal hearing, lips & tongue Neck: no LAD, masses or thyromegaly Cardiovascular: RRR, no m/r/g. No LE edema. Telemetry: SR, no arrhythmias  Respiratory: CTA bilaterally, no w/r/r. Normal respiratory effort. Abdomen: soft, ntnd Skin: no rash or induration seen on limited exam Musculoskeletal: Pain with movement of left lower extremity, no pain on palpation of left hip Psychiatric: Not oriented to person place or time Neurologic: Difficult exam due to limited cooperation. Arousable on command. Moving all extremities           Labs on Admission:  Basic Metabolic Panel:  Recent Labs Lab 10/21/13 1643  NA 143  K 4.0  CL 102  CO2 23  GLUCOSE 103*  BUN 14  CREATININE 0.60  CALCIUM 9.0   Liver Function Tests: No results found for this basename: AST, ALT, ALKPHOS, BILITOT, PROT, ALBUMIN,  in the last 168 hours No results found for this basename: LIPASE, AMYLASE,  in the last 168 hours No results found for this basename: AMMONIA,  in the last 168 hours CBC:  Recent Labs Lab 10/21/13 1643  WBC 6.6  NEUTROABS 5.2  HGB 12.2  HCT 36.8  MCV 85.2  PLT 223   Cardiac Enzymes:  Recent Labs Lab 10/21/13 1643   TROPONINI <0.30    BNP (last 3 results) No results found for this basename: PROBNP,  in the last 8760 hours CBG: No results found for this basename: GLUCAP,  in the last 168 hours  Radiological Exams on Admission: Dg Chest 2 View  10/21/2013   CLINICAL DATA:  Left hip/leg pain. History of hypertension and stroke.  EXAM: CHEST  2 VIEW  COMPARISON:  Radiographs 12/16/2011.  FINDINGS: 1750 hr. The heart size and mediastinal contours are normal. The lungs are clear. There is no pleural effusion or pneumothorax. No acute osseous findings are identified. Skin folds and telemetry leads overlie the chest. There is mild thoracic spondylosis.  IMPRESSION: No active cardiopulmonary process.   Electronically Signed   By: Roxy Horseman M.D.   On: 10/21/2013 18:01   Dg Hip Complete Left  10/21/2013   CLINICAL DATA:  Left hip and leg pain.  EXAM: LEFT HIP - COMPLETE 2+ VIEW  COMPARISON:  DG ABDOMEN 1V dated 12/05/2012; CT ABD/PELVIS W CM dated 11/29/2012  FINDINGS: The bones are diffusely demineralized. No acute fracture, dislocation or femoral head avascular necrosis is demonstrated. There are degenerative changes of the hips, sacroiliac joints and lower lumbar spine. Scattered pelvic calcifications are grossly stable.  IMPRESSION: No evidence of acute left hip injury. If the patient has persistent hip pain or inability to bear weight, follow up imaging may be warranted as hip fractures can be radiographically occult in the elderly.   Electronically Signed   By: Roxy Horseman M.D.   On: 10/21/2013 18:00   Ct Hip Left Wo Contrast  10/21/2013   CLINICAL DATA:  80 year old with left hip pain after falling from a wheelchair.  EXAM: CT OF THE LEFT HIP WITHOUT CONTRAST  TECHNIQUE: Multidetector CT imaging was performed according to the standard protocol. Multiplanar CT image reconstructions were also generated.  COMPARISON:  DG HIP COMPLETE*L* dated 10/21/2013; CT ABD W/CM dated 01/26/2009  FINDINGS: The bones are diffusely  demineralized. There is diffuse chondrocalcinosis at the hips and symphysis pubis. There is spurring of the ischial tuberosity and greater trochanter. No acute fracture or dislocation is demonstrated.  There is no large hip joint effusion or periarticular hematoma. Sigmoid colon diverticular changes and scattered vascular calcifications are noted.  IMPRESSION: No evidence of acute left hip fracture or dislocation. Degenerative changes, diffuse chondrocalcinosis and osteopenia noted.   Electronically Signed   By: Roxy Horseman M.D.   On: 10/21/2013 20:21    EKG: Independently reviewed. Sinus tachycardia with no ST elevations or depressions.  Assessment/Plan Active Problems:   Failure to thrive - Nutrition consult -Physical therapy -Maintenance IV fluids    Arthritis - Supportive therapy and in pain medication when necessary    CVA (cerebral infarction) -Continue aspirin, currently stable    Left hip pain -  Most likely causing failure to thrive. - Pain control, supportive therapy -Physical therapy - CT of the left hip show no acute fracture  Hypertension: - Continue amlodipine   DVT prophylaxis: Heparin subcutaneous  Code Status: Patient is DO NOT RESUSCITATE Re: chest compressions. Would like intubation or pressors if needed Family Communication: Discussed with daughter at bedside Disposition Plan: Pending PT, evaluation may need goals of care discussion with family  Time spent: > 60 minutes  Penny PiaVEGA, [place] Tarnow [facility] Pager [phone]

## 2013-10-21 NOTE — ED Provider Notes (Signed)
CSN: [id number]     Arrival date & time 10/21/13  1552 History   First MD Initiated Contact with Patient 10/21/13 1603     Chief Complaint  Patient presents with  . Fall  . Failure To Thrive    Patient is a 80 y.o. female presenting with fall. The history is provided by a caregiver and a relative. The history is limited by the condition of the patient (Hx CVA with aphasia).  Fall  Pt was seen at 1625. Per pt's family, c/o gradual onset and worsening of left hip "pain" for the past 1 to 2 weeks. Pt's family states she fell out of her wheelchair while at the NH 1 to 2 weeks ago before the pain began. Pt's family states pt also has had decreased PO intake and increasing lethargy over the past 1 to 2 weeks. Family states they took her home from the NH 2 days ago and "she won't eat or drink at all" and "her breathing sounds rattling." Denies new falls, no fevers, no vomiting/diarrhea, no SOB, no apnea, no syncope.     Past Medical History  Diagnosis Date  . Arthritis   . HTN (hypertension)     Taken off BP meds by PCP when BP normalized  . TIA (transient ischemic attack)   . Encephalopathy   . Stroke     right sided weakness, speech difficulty  . RBBB (right bundle branch block)   . Malnutrition    Past Surgical History  Procedure Laterality Date  . Abdominal hysterectomy     Family History  Problem Relation Age of Onset  . Heart disease Father   . Cancer Brother   . Cancer Brother   . Dementia Son    History  Substance Use Topics  . Smoking status: Never Smoker   . Smokeless tobacco: Never Used  . Alcohol Use: No    Review of Systems  Unable to perform ROS: Patient nonverbal    Allergies  Review of patient's allergies indicates no known allergies.  Home Medications   Current Outpatient Rx  Name  Route  Sig  Dispense  Refill  . amLODipine (NORVASC) 10 MG tablet   Oral   Take 0.5 tablets (5 mg total) by mouth daily.   5 tablet      . aspirin EC 325 MG EC  tablet   Oral   Take 1 tablet (325 mg total) by mouth daily.   30 tablet   0   . simvastatin (ZOCOR) 20 MG tablet   Oral   Take 1 tablet (20 mg total) by mouth daily at 6 PM.   30 tablet   0    BP 164/82  Pulse 106  Temp(Src) 98.8 F (37.1 C) (Oral)  SpO2 99% Physical Exam 1630: Physical examination:  Nursing notes reviewed; Vital signs and O2 SAT reviewed;  Constitutional: Thin, frail, uncomfortable appearing.; Head:  Normocephalic, atraumatic; Eyes: EOMI, PERRL, No scleral icterus; ENMT: Mouth and pharynx normal, Mucous membranes dry; Neck: Supple, Full range of motion, No lymphadenopathy; Cardiovascular: Regular rate and rhythm, No gallop; Respiratory: Breath sounds coarse & equal bilaterally, No wheezes. Normal respiratory effort/excursion; Chest: Nontender, Movement normal; Abdomen: Soft, Nontender, Nondistended, Normal bowel sounds; Genitourinary: No CVA tenderness; Extremities: Pulses normal, +left hip tenderness to palp. NT right hip, bilat knees/ankles/feet. Pelvis stable. No edema, No calf edema or asymmetry.; Neuro: Awake, alert, moans. Non-verbal per baseline. No facial droop. Decreased ROM left hip, otherwise moves all extremities on stretcher spontaneously..; Skin:  Color normal, Warm, Dry.   ED Course  Procedures   EKG Interpretation   None       MDM  MDM Reviewed: previous chart, nursing note and vitals Reviewed previous: labs and ECG Interpretation: labs, ECG and x-ray     Results for orders placed during the hospital encounter of 10/21/13  URINALYSIS W MICROSCOPIC + REFLEX CULTURE      Result Value Range   Color, Urine YELLOW  YELLOW   APPearance CLEAR  CLEAR   Specific Gravity, Urine 1.024  1.005 - 1.030   pH 6.5  5.0 - 8.0   Glucose, UA NEGATIVE  NEGATIVE mg/dL   Hgb urine dipstick NEGATIVE  NEGATIVE   Bilirubin Urine SMALL (*) NEGATIVE   Ketones, ur 15 (*) NEGATIVE mg/dL   Protein, ur 30 (*) NEGATIVE mg/dL   Urobilinogen, UA 1.0  0.0 - 1.0 mg/dL    Nitrite NEGATIVE  NEGATIVE   Leukocytes, UA NEGATIVE  NEGATIVE   Squamous Epithelial / LPF RARE  RARE   Urine-Other MUCOUS PRESENT    CBC WITH DIFFERENTIAL      Result Value Range   WBC 6.6  4.0 - 10.5 K/uL   RBC 4.32  3.87 - 5.11 MIL/uL   Hemoglobin 12.2  12.0 - 15.0 g/dL   HCT 40.936.8  81.136.0 - 91.446.0 %   MCV 85.2  78.0 - 100.0 fL   MCH 28.2  26.0 - 34.0 pg   MCHC 33.2  30.0 - 36.0 g/dL   RDW 78.214.3  95.611.5 - 21.315.5 %   Platelets 223  150 - 400 K/uL   Neutrophils Relative % 79 (*) 43 - 77 %   Neutro Abs 5.2  1.7 - 7.7 K/uL   Lymphocytes Relative 13  12 - 46 %   Lymphs Abs 0.9  0.7 - 4.0 K/uL   Monocytes Relative 7  3 - 12 %   Monocytes Absolute 0.5  0.1 - 1.0 K/uL   Eosinophils Relative 1  0 - 5 %   Eosinophils Absolute 0.0  0.0 - 0.7 K/uL   Basophils Relative 0  0 - 1 %   Basophils Absolute 0.0  0.0 - 0.1 K/uL  BASIC METABOLIC PANEL      Result Value Range   Sodium 143  137 - 147 mEq/L   Potassium 4.0  3.7 - 5.3 mEq/L   Chloride 102  96 - 112 mEq/L   CO2 23  19 - 32 mEq/L   Glucose, Bld 103 (*) 70 - 99 mg/dL   BUN 14  6 - 23 mg/dL   Creatinine, Ser 0.860.60  0.50 - 1.10 mg/dL   Calcium 9.0  8.4 - 57.810.5 mg/dL   GFR calc non Af Amer 72 (*) >90 mL/min   GFR calc Af Amer 83 (*) >90 mL/min  TROPONIN I      Result Value Range   Troponin I <0.30  <0.30 ng/mL  LACTIC ACID, PLASMA      Result Value Range   Lactic Acid, Venous 1.4  0.5 - 2.2 mmol/L   Dg Chest 2 View 10/21/2013   CLINICAL DATA:  Left hip/leg pain. History of hypertension and stroke.  EXAM: CHEST  2 VIEW  COMPARISON:  Radiographs 12/16/2011.  FINDINGS: 1750 hr. The heart size and mediastinal contours are normal. The lungs are clear. There is no pleural effusion or pneumothorax. No acute osseous findings are identified. Skin folds and telemetry leads overlie the chest. There is mild thoracic spondylosis.  IMPRESSION:  No active cardiopulmonary process.   Electronically Signed   By: Roxy Horseman M.D.   On: 10/21/2013 18:01   Dg  Hip Complete Left 10/21/2013   CLINICAL DATA:  Left hip and leg pain.  EXAM: LEFT HIP - COMPLETE 2+ VIEW  COMPARISON:  DG ABDOMEN 1V dated 12/05/2012; CT ABD/PELVIS W CM dated 11/29/2012  FINDINGS: The bones are diffusely demineralized. No acute fracture, dislocation or femoral head avascular necrosis is demonstrated. There are degenerative changes of the hips, sacroiliac joints and lower lumbar spine. Scattered pelvic calcifications are grossly stable.  IMPRESSION: No evidence of acute left hip injury. If the patient has persistent hip pain or inability to bear weight, follow up imaging may be warranted as hip fractures can be radiographically occult in the elderly.   Electronically Signed   By: Roxy Horseman M.D.   On: 10/21/2013 18:00   Ct Hip Left Wo Contrast 10/21/2013   CLINICAL DATA:  80 year old with left hip pain after falling from a wheelchair.  EXAM: CT OF THE LEFT HIP WITHOUT CONTRAST  TECHNIQUE: Multidetector CT imaging was performed according to the standard protocol. Multiplanar CT image reconstructions were also generated.  COMPARISON:  DG HIP COMPLETE*L* dated 10/21/2013; CT ABD W/CM dated 01/26/2009  FINDINGS: The bones are diffusely demineralized. There is diffuse chondrocalcinosis at the hips and symphysis pubis. There is spurring of the ischial tuberosity and greater trochanter. No acute fracture or dislocation is demonstrated.  There is no large hip joint effusion or periarticular hematoma. Sigmoid colon diverticular changes and scattered vascular calcifications are noted.  IMPRESSION: No evidence of acute left hip fracture or dislocation. Degenerative changes, diffuse chondrocalcinosis and osteopenia noted.   Electronically Signed   By: Roxy Horseman M.D.   On: 10/21/2013 20:21    2045:  Appears clinically dehydrated. Will continue judicious IVF. Dx and testing d/w pt and family.  Questions answered.  Verb understanding, agreeable to observation admit.  T/C to [facility]. Cena Benton, case discussed,  including:  HPI, pertinent PM/SHx, VS/PE, dx testing, ED course and treatment:  Agreeable to observation admit, requests to write temporary orders, obtain medical bed to team 10.    Laray Anger, DO 10/22/13 1239

## 2013-10-21 NOTE — ED Notes (Signed)
Patient transported to CT 

## 2013-10-21 NOTE — ED Notes (Addendum)
Pt resting, reports she is feeling better after pain medicine. Snoring, unlabored respirations. Family at bedside.

## 2013-10-21 NOTE — ED Notes (Signed)
Pt discharged from rehab facility on Friday (was admitted for recent TIAs and right sided weakness). Pt reportedly fell out of wheelchair while she was in rehab facility- date of fall is unknown. Pt came from home today because of left hip pain from fall.

## 2013-10-21 NOTE — ED Notes (Addendum)
Tamara OsgoodDorothy Miller  Pt daughter Phone # [phone]

## 2013-10-22 DIAGNOSIS — E43 Unspecified severe protein-calorie malnutrition: Secondary | ICD-10-CM | POA: Insufficient documentation

## 2013-10-22 DIAGNOSIS — D649 Anemia, unspecified: Secondary | ICD-10-CM

## 2013-10-22 LAB — CBC
HEMATOCRIT: 31.6 % — AB (ref 36.0–46.0)
Hemoglobin: 10.3 g/dL — ABNORMAL LOW (ref 12.0–15.0)
MCH: 28.1 pg (ref 26.0–34.0)
MCHC: 32.6 g/dL (ref 30.0–36.0)
MCV: 86.1 fL (ref 78.0–100.0)
PLATELETS: 215 10*3/uL (ref 150–400)
RBC: 3.67 MIL/uL — AB (ref 3.87–5.11)
RDW: 14.3 % (ref 11.5–15.5)
WBC: 5.9 10*3/uL (ref 4.0–10.5)

## 2013-10-22 LAB — BASIC METABOLIC PANEL
BUN: 13 mg/dL (ref 6–23)
CHLORIDE: 106 meq/L (ref 96–112)
CO2: 22 meq/L (ref 19–32)
Calcium: 8.5 mg/dL (ref 8.4–10.5)
Creatinine, Ser: 0.54 mg/dL (ref 0.50–1.10)
GFR calc non Af Amer: 74 mL/min — ABNORMAL LOW (ref >90)
GFR, EST AFRICAN AMERICAN: 86 mL/min — AB (ref >90)
Glucose, Bld: 140 mg/dL — ABNORMAL HIGH (ref 70–99)
Potassium: 3.8 mEq/L (ref 3.7–5.3)
SODIUM: 142 meq/L (ref 137–147)

## 2013-10-22 LAB — GLUCOSE, CAPILLARY: GLUCOSE-CAPILLARY: 147 mg/dL — AB (ref 70–99)

## 2013-10-22 NOTE — Evaluation (Signed)
Physical Therapy Evaluation Patient Details Name: Tamara Miller MRN: [id number] DOB: 05/29/1910 Today's Date: 10/22/2013 Time: 8295-6213 PT Time Calculation (min): 27 min  PT Assessment / Plan / Recommendation History of Present Illness   Tamara Miller is a 80 y.o. female  With history of CVA, hypertension, arthritis. With recent history of a fall approximately 2 weeks ago on her left hip. Since the fall has had discomfort in her left hip which reportedly has not gotten better and has gotten worse. Patient has had limited mobility as a result. Patient had a stroke her approximately 3 weeks ago and has had difficulty ambulating as a result. Family has been using a wheelchair for transportation. Reportedly fell from her wheelchair recently. Family reports the patient has had much discomfort in her left hip and has recently had decreased oral intake of solids and fluids. Given persistence of symptoms family decided to bring patient to the ED for further evaluation.    Clinical Impression  Pt admitted with above. Pt currently with functional limitations due to the deficits listed below (see PT Problem List).  Pt will benefit from skilled PT to increase their independence and safety with mobility to allow discharge to the venue listed below.   Pt's Granddaughter, Velna Hatchet, expresses concern at how manageable current situation is for family; Still, Pt's daughter is aware that this hopital stay is not a qualifying stay to justify SNF (for Medicare purposes); considering this, it is worth finding resources for as much in-home help as possible  Spoke with Dr. Catha Gosselin re: appropriateness of a Palliative Care Team consult for Goals of Care        PT Assessment  Patient needs continued PT services    Follow Up Recommendations  SNF;Other (comment) (While from a functional mobility standpoint, pt would benefit from SNF stay, this admission will not qualify her from a Medicare standpoint;  Therefore will recommend HHPT/OT/Aide, and 24 hour assistance)    Does the patient have the potential to tolerate intense rehabilitation      Barriers to Discharge   Recommend as much help as possible for the care of Ms. Chief Operating Officer cushion (measurements PT);Other (comment) (hoyer lift, ramp, air mattress overlay)    Recommendations for Other Services OT consult (For ADLs/family education)   Frequency Min 3X/week    Precautions / Restrictions Precautions Precautions: Fall Restrictions Weight Bearing Restrictions: No   Pertinent Vitals/Pain Unable to rate pain, but noted pt yelling out with all mobility patient repositioned for comfort       Mobility  Bed Mobility Overal bed mobility: Needs Assistance;+2 for physical assistance;+ 2 for safety/equipment Bed Mobility: Supine to Sit Supine to sit: +2 for physical assistance;Total assist General bed mobility comments: Total assist for all mobility; Painful, yells with any motion of LEs Transfers Overall transfer level: Needs assistance Equipment used: None Transfers: Squat Pivot Transfers Squat pivot transfers: +2 physical assistance;Total assist General transfer comment: Basic squat pivot transfer bed to chair towards right side; Used bed pad to support hips during transfer, and blocked bil knees    Exercises     PT Diagnosis: Acute pain;Generalized weakness  PT Problem List: Decreased strength;Decreased range of motion;Decreased activity tolerance;Decreased balance;Decreased mobility;Decreased coordination;Decreased cognition;Decreased knowledge of use of DME;Pain PT Treatment Interventions: DME instruction;Gait training;Functional mobility training;Therapeutic activities;Therapeutic exercise;Balance training;Neuromuscular re-education;Patient/family education     PT Goals(Current goals can be found in the care plan section) Acute Rehab PT Goals Patient Stated Goal: Family hopes  to  be able to care for Ms Solimine well at home PT Goal Formulation: With family Time For Goal Achievement: 11/05/13 Potential to Achieve Goals: Fair Additional Goals Additional Goal #1: Family will demonstrate safe patient handling techniques when assisting pt with mobility  Visit Information  Last PT Received On: 10/22/13 Assistance Needed: +2 History of Present Illness:  Tamara Miller is a 80 y.o. female  With history of CVA, hypertension, arthritis. With recent history of a fall approximately 2 weeks ago on her left hip. Since the fall has had discomfort in her left hip which reportedly has not gotten better and has gotten worse. Patient has had limited mobility as a result. Patient had a stroke her approximately 3 weeks ago and has had difficulty ambulating as a result. Family has been using a wheelchair for transportation. Reportedly fell from her wheelchair recently. Family reports the patient has had much discomfort in her left hip and has recently had decreased oral intake of solids and fluids. Given persistence of symptoms family decided to bring patient to the ED for further evaluation.       Prior Functioning  Home Living Family/patient expects to be discharged to:: Private residence Living Arrangements: Spouse/significant other;Other (Comment) (dc to her own home; family taking shifts for 24 hour care) Available Help at Discharge: Family;Available 24 hours/day Type of Home: House Home Access: Stairs to enter [employer] of Steps: 3 Entrance Stairs-Rails: None (Great-nephew carried her into her home when [facility] dc'd her) Home Layout: One level Home Equipment: Hospital bed;Wheelchair - [employer] - 2 wheels;Cane - single point Additional Comments: pt's daughter lives nextdoor and goes back and forth to pt's home all day and stays with pt at night.  Daughter is also caring for a 33 yr old and 72 yr old.  Granddaughter lives away and tries to A as she can.   Whole family has pulled together for 24 hour care Prior Function Level of Independence: Needs assistance Gait / Transfers Assistance Needed: Mostly wheelchair-bound since las admission Dec Communication / Swallowing Assistance Needed: pt HOH, but responds to questions.   Comments: Pt's Granddaughter, Velna Hatchet, expresses concern at how manageable current situation is for family; Still, Pt's daughter is aware that this hopital stay is not a qualifying stay to justify SNF (for Medicare purposes); considering this, it is worth finding resources for as much in-home help as possible Communication Communication: HOH    Cognition  Cognition Arousal/Alertness: Lethargic Behavior During Therapy: Anxious Overall Cognitive Status: Impaired/Different from baseline Area of Impairment: Attention;Following commands Current Attention Level: Sustained (Internally distracted by pain) Following Commands: Follows one step commands inconsistently    Extremity/Trunk Assessment Upper Extremity Assessment Upper Extremity Assessment: Generalized weakness Lower Extremity Assessment Lower Extremity Assessment: RLE deficits/detail;LLE deficits/detail RLE Deficits / Details: Painful with motion hip and knee; Tending to hold bil LEs in hip/knees flexed position in bed; Noted bil knee edema, and pain with PROM into extension LLE Deficits / Details: Painful with motion hip and knee; Tending to hold bil LEs in hip/knees flexed position in bed; Noted bil knee edema, and pain with PROM into extension   Balance Balance Overall balance assessment: Needs assistance Sitting-balance support: Bilateral upper extremity supported;Feet supported Sitting balance-Leahy Scale: Poor Postural control: Posterior lean  End of Session PT - End of Session Equipment Utilized During Treatment:  (bed pad) Activity Tolerance: Patient limited by pain Patient left: in chair Nurse Communication: Mobility status  GP Functional Assessment Tool  Used: Clinical Judgement Functional  Limitation: Mobility: Walking and moving around Mobility: Walking and Moving Around Current Status [phone]): 100 percent impaired, limited or restricted Mobility: Walking and Moving Around Goal Status [phone]([id number]): At least 20 percent but less than 40 percent impaired, limited or restricted   Van ClinesGarrigan, Betta Balla [facility] Hospitalamff SneadHolly Jahlen Bollman, [place] CarolinaPT [phone]  10/22/2013, 1:47 PM

## 2013-10-22 NOTE — Progress Notes (Signed)
UR completed. Patient changed to inpatient r/t requiring IVF @ 100cc/hr  

## 2013-10-22 NOTE — Progress Notes (Signed)
[facility]                                                                              Patient Demographics  Tamara Miller, is a 80 y.o. female, DOB - 05/14/1910, ZOX:[id number]  Admit date - 10/21/2013   Admitting Physician Penny Piarlando Vega, MD  Outpatient Primary MD for the patient is Delorse LekBURNETT,BRENT A, MD  LOS - 1   Chief Complaint  Patient presents with  . Fall  . Failure To Thrive        Assessment & Plan   Failure to thrive  -Nutrition consulted  -Physical therapy consulted, pending evaluation -Maintenance IV fluids   Arthritis  -Supportive therapy and pain control  History of CVA (cerebral infarction)  -Continue aspirin, currently stable   Left hip pain  -Most likely causing failure to thrive.  -Pain control, supportive therapy  -Physical therapy  -CT of the left hip show no acute fracture   Hypertension -Continue amlodipine   Anemia -Likely dilutional, will continue to monitor H/H   Code Status: Full  Family Communication: None at bedside  Disposition Plan: Admitted as observation, pending PT consult, will likely discharge 1/20  Time Spent in minutes   30 minutes  Procedures None  Consults   None  DVT Prophylaxis Heparin  Lab Results  Component Value Date   PLT 215 10/22/2013    Medications  Scheduled Meds: . sodium chloride   Intravenous STAT  . amLODipine  5 mg Oral Daily  . aspirin EC  325 mg Oral Daily  . heparin  5,000 Units Subcutaneous Q8H  . simvastatin  20 mg Oral q1800   Continuous Infusions: . dextrose 5 % and 0.45 % NaCl with KCl 20 mEq/L 100 mL/hr at 10/22/13 0026   PRN Meds:.acetaminophen, acetaminophen, morphine injection, ondansetron (ZOFRAN) IV, ondansetron, oxyCODONE  Antibiotics    Anti-infectives   None      Subjective:   Tamara Miller seen and examined today. Patient has no new complaints.  Still complains of hip pain.   Objective:   Filed Vitals:   10/21/13 2000 10/21/13 2200  10/21/13 2202 10/22/13 0609  BP: 168/88 172/86  178/94  Pulse: 96 97  94  Temp:   98.4 F (36.9 C) 98.8 F (37.1 C)  TempSrc:   Oral Oral  Resp:    18  SpO2: 94% 95%  98%    Wt Readings from Last 3 Encounters:  09/18/13 52.164 kg (115 lb)  12/16/11 52.935 kg (116 lb 11.2 oz)  10/29/11 54.2 kg (119 lb 7.8 oz)     Intake/Output Summary (Last 24 hours) at 10/22/13 0745 Last data filed at 10/22/13 0610  Gross per 24 hour  Intake    240 ml  Output      0 ml  Net    240 ml    Exam  General: Well developed, well nourished, NAD, appears stated age  HEENT: NCAT, PERRLA, EOMI, Anicteic Sclera, No pharyngeal erythema or exudates  Neck: Supple, no JVD, no masses  Cardiovascular: S1 S2 auscultated, no rubs, murmurs or gallops. Regular rate and rhythm.  Respiratory: Clear to auscultation bilaterally with equal chest rise  Abdomen: Soft, nontender, nondistended, +  bowel sounds  Extremities: warm dry without cyanosis clubbing or edema, Pain with movement of LLE.   Neuro:Awake and alert.  Oriented to self.  Able to follow direction.   Skin: Without rashes exudates or nodules  Data Review   Micro Results No results found for this or any previous visit (from the past 240 hour(s)).  Radiology Reports Dg Chest 2 View  10/21/2013   CLINICAL DATA:  Left hip/leg pain. History of hypertension and stroke.  EXAM: CHEST  2 VIEW  COMPARISON:  Radiographs 12/16/2011.  FINDINGS: 1750 hr. The heart size and mediastinal contours are normal. The lungs are clear. There is no pleural effusion or pneumothorax. No acute osseous findings are identified. Skin folds and telemetry leads overlie the chest. There is mild thoracic spondylosis.  IMPRESSION: No active cardiopulmonary process.   Electronically Signed   By: Roxy Horseman M.D.   On: 10/21/2013 18:01   Dg Hip Complete Left  10/21/2013   CLINICAL DATA:  Left hip and leg pain.  EXAM: LEFT HIP - COMPLETE 2+ VIEW  COMPARISON:  DG ABDOMEN 1V dated  12/05/2012; CT ABD/PELVIS W CM dated 11/29/2012  FINDINGS: The bones are diffusely demineralized. No acute fracture, dislocation or femoral head avascular necrosis is demonstrated. There are degenerative changes of the hips, sacroiliac joints and lower lumbar spine. Scattered pelvic calcifications are grossly stable.  IMPRESSION: No evidence of acute left hip injury. If the patient has persistent hip pain or inability to bear weight, follow up imaging may be warranted as hip fractures can be radiographically occult in the elderly.   Electronically Signed   By: Roxy Horseman M.D.   On: 10/21/2013 18:00   Ct Hip Left Wo Contrast  10/21/2013   CLINICAL DATA:  80 year old with left hip pain after falling from a wheelchair.  EXAM: CT OF THE LEFT HIP WITHOUT CONTRAST  TECHNIQUE: Multidetector CT imaging was performed according to the standard protocol. Multiplanar CT image reconstructions were also generated.  COMPARISON:  DG HIP COMPLETE*L* dated 10/21/2013; CT ABD W/CM dated 01/26/2009  FINDINGS: The bones are diffusely demineralized. There is diffuse chondrocalcinosis at the hips and symphysis pubis. There is spurring of the ischial tuberosity and greater trochanter. No acute fracture or dislocation is demonstrated.  There is no large hip joint effusion or periarticular hematoma. Sigmoid colon diverticular changes and scattered vascular calcifications are noted.  IMPRESSION: No evidence of acute left hip fracture or dislocation. Degenerative changes, diffuse chondrocalcinosis and osteopenia noted.   Electronically Signed   By: Roxy Horseman M.D.   On: 10/21/2013 20:21    CBC  Recent Labs Lab 10/21/13 1643 10/22/13 0440  WBC 6.6 5.9  HGB 12.2 10.3*  HCT 36.8 31.6*  PLT 223 215  MCV 85.2 86.1  MCH 28.2 28.1  MCHC 33.2 32.6  RDW 14.3 14.3  LYMPHSABS 0.9  --   MONOABS 0.5  --   EOSABS 0.0  --   BASOSABS 0.0  --     Chemistries   Recent Labs Lab 10/21/13 1643 10/22/13 0440  NA 143 142  K 4.0 3.8    CL 102 106  CO2 23 22  GLUCOSE 103* 140*  BUN 14 13  CREATININE 0.60 0.54  CALCIUM 9.0 8.5   ------------------------------------------------------------------------------------------------------------------ CrCl is unknown because both a height and weight (above a minimum accepted value) are required for this calculation. ------------------------------------------------------------------------------------------------------------------ No results found for this basename: HGBA1C,  in the last 72 hours ------------------------------------------------------------------------------------------------------------------ No results found for this basename: CHOL,  HDL, LDLCALC, TRIG, CHOLHDL, LDLDIRECT,  in the last 72 hours ------------------------------------------------------------------------------------------------------------------ No results found for this basename: TSH, T4TOTAL, FREET3, T3FREE, THYROIDAB,  in the last 72 hours ------------------------------------------------------------------------------------------------------------------ No results found for this basename: VITAMINB12, FOLATE, FERRITIN, TIBC, IRON, RETICCTPCT,  in the last 72 hours  Coagulation profile No results found for this basename: INR, PROTIME,  in the last 168 hours  No results found for this basename: DDIMER,  in the last 72 hours  Cardiac Enzymes  Recent Labs Lab 10/21/13 1643  TROPONINI <0.30   ------------------------------------------------------------------------------------------------------------------ No components found with this basename: POCBNP,     Sohana Austell D.O. on 10/22/2013 at 7:45 AM  Between 7am to 7pm - Pager - [phone]  After 7pm go to [email or web address] - password [email or web address]  And look for the night coverage person covering for me after hours  [facility] Office  [phone]

## 2013-10-22 NOTE — Progress Notes (Signed)
Clinical Social Work Department BRIEF PSYCHOSOCIAL ASSESSMENT 10/22/2013  Patient:  Tamara Miller,Tamara Miller     Account Number:  [id number]     Admit date:  10/21/2013  Clinical Social Worker:  Harless NakayamaAMBELAL,Rylynne Schicker, LCSWA  Date/Time:  10/22/2013 10:00 AM  Referred by:  Physician  Date Referred:  10/22/2013 Referred for  SNF Placement   Other Referral:   Interview type:  Family Other interview type:   Spoke with pt daughter over the phone    PSYCHOSOCIAL DATA Living Status:  ALONE Admitted from facility:   Level of care:   Primary support name:  Germain OsgoodDorothy Spruill [phone] Primary support relationship to patient:  CHILD, ADULT Degree of support available:   Pt has very supportive family that is involved with care    CURRENT CONCERNS Current Concerns  Post-Acute Placement   Other Concerns:    SOCIAL WORK ASSESSMENT / PLAN CSW informed pt was possibly admitted from facility. CSW spoke with pt daughter over the phone. Pt daughter stated [facility] but she had used up her Medicare days. CSW explained that pt is currently observation and will not have a qualifying stay to make her eligible for further medicare days at this time. Pt daughter was understanding. Pt daughter informed CSW that though it is not ideal she is able to have pt return home. Pt lives alone and pt daughter lives next door. Pt however is never alone at pt daughter and grandchildren take turns staying with pt. Pt daughter asked if pt would be able to get therapy. CSW briefly explained HH, which daughter was aware of and said pt was open with [facility]. CSW informed pt daughter that RN CM would contact family if needed and make sure they have available resources. CSW asked if pt family has every tried applying for Medicaid for pt as this may help down the road if pt will need long term placement. Pt daughter stated that pt still has asessets such as her home under her name. CSW informed daughter that it may be  helpful to look into transferring or selling assessts in the future. Pt daughter understanding of this and thankful for CSW help.    CSW spoke with RN CM and updated on situation. At this time, no further social work needs. CSW signing off, please consult if new needs arise.   Assessment/plan status:  No Further Intervention Required Other assessment/ plan:   Information/referral to community resources:   Medicare observation/inpatient qualifying stay information    PATIENT'S/FAMILY'S RESPONSE TO PLAN OF CARE: Pt family would like pt to return to SNF but is understanding of situation and that pt will have to return home.       Bradie Lacock, LCSWA [phone]

## 2013-10-22 NOTE — Progress Notes (Signed)
INITIAL NUTRITION ASSESSMENT  DOCUMENTATION CODES Per approved criteria  -Severe malnutrition in the context of acute illness or injury   INTERVENTION: 1.  As diet advances, provide PO supplement  NUTRITION DIAGNOSIS: Inadequate oral intake related to inability to eat as evidenced by NPO status.   Goal: Patient to meet >/=90% of estimated nutrition needs  Monitor:  PO diet advancement, weight, I/Os, labs  Reason for Assessment: Nutrition Consult-Malnutrition  80 y.o. female  Admitting Dx: Left hip discomfort  ASSESSMENT: Patient with PMH of CVA, hypertension, and arthritis presented to the ER with discomfort in her left hip. Patient has a history of a fall on her left hip approximately 2 weeks and she suffered a stroke approximately 3 weeks ago. Both incidents have caused a limited mobility. Family reports that the patient has had much discomfort in her left him and has recently had a decreased oral intake of solids and fluids.   Patient was sleeping upon dietetic intern visit. Per patient's granddaughter (not primary caregiver), patient has had difficulty eating and drinking since recent stroke. The granddaughter reported the patient has trouble swallowing some foods and has noted that she sometimes starts coughing during meals.   Prior to having a stroke, the granddaughter stated that the patient was able to eat whatever she wanted, but after the stroke 3 weeks ago, she has been limited to a softer diet with foods like apple sauce and creamed potatoes. The granddaughter reported that the patient has not lost any weight recently but stated that her grandmother sleeps most of the day.     Patient is currently NPO, but as diet advances the patient would benefit from PO supplement.  Nutrition Focused Physical Exam:  Subcutaneous Fat:  Orbital Region: WNL Upper Arm Region: mild to moderate depletion Thoracic and Lumbar Region: N/A  Muscle:  Temple Region: severe  depletion Clavicle Bone Region: severe depletion Clavicle and Acromion Bone Region: severe depletion Scapular Bone Region: N/A Dorsal Hand: N/A Patellar Region: WNL Anterior Thigh Region: moderate depletion Posterior Calf Region: WNL  Edema: absent  Patient meets criteria for severe malnutrition in the context of acute illness as evidenced by </= 50% energy intake for >/= 5 days and severe muscle mass depletion.  Height: Ht Readings from Last 1 Encounters:  09/18/13 5\' 2"  (1.575 m)    Weight: Wt Readings from Last 1 Encounters:  09/18/13 115 lb (52.164 kg)    Ideal Body Weight: 110 lb  % Ideal Body Weight: 104%  Wt Readings from Last 10 Encounters:  09/18/13 115 lb (52.164 kg)  12/16/11 116 lb 11.2 oz (52.935 kg)  10/29/11 119 lb 7.8 oz (54.2 kg)    Usual Body Weight: 115 lb  % Usual Body Weight: 100%  BMI:  21 kg/m2  Estimated Nutritional Needs: Kcal: 1200-1400 Protein: 60-70 grams Fluid: 1.5 L  Skin: no wounds  Diet Order: NPO  EDUCATION NEEDS: -No education needs identified at this time   Intake/Output Summary (Last 24 hours) at 10/22/13 1007 Last data filed at 10/22/13 0610  Gross per 24 hour  Intake    240 ml  Output      0 ml  Net    240 ml    Last BM: PTA  Labs:   Recent Labs Lab 10/21/13 1643 10/22/13 0440  NA 143 142  K 4.0 3.8  CL 102 106  CO2 23 22  BUN 14 13  CREATININE 0.60 0.54  CALCIUM 9.0 8.5  GLUCOSE 103* 140*  CBG (last 3)  No results found for this basename: GLUCAP,  in the last 72 hours  Scheduled Meds: . sodium chloride   Intravenous STAT  . amLODipine  5 mg Oral Daily  . aspirin EC  325 mg Oral Daily  . heparin  5,000 Units Subcutaneous Q8H  . simvastatin  20 mg Oral q1800    Continuous Infusions: . dextrose 5 % and 0.45 % NaCl with KCl 20 mEq/L 100 mL/hr at 10/22/13 21300026    Past Medical History  Diagnosis Date  . Arthritis   . HTN (hypertension)     Taken off BP meds by PCP when BP normalized   . TIA (transient ischemic attack)   . Encephalopathy   . Stroke     right sided weakness, speech difficulty  . RBBB (right bundle branch block)   . Malnutrition     Past Surgical History  Procedure Laterality Date  . Abdominal hysterectomy      Marlane MingleAshley Bower, Dietetic Intern Pager: [phone]  Intern note reviewed. Corrections/additions made.  Kendell BaneHeather Shirl Ludington RD, LDN, CNSC [phone] Pager [phone] After Hours Pager

## 2013-10-23 DIAGNOSIS — E43 Unspecified severe protein-calorie malnutrition: Secondary | ICD-10-CM

## 2013-10-23 DIAGNOSIS — Z515 Encounter for palliative care: Secondary | ICD-10-CM

## 2013-10-23 DIAGNOSIS — R52 Pain, unspecified: Secondary | ICD-10-CM

## 2013-10-23 DIAGNOSIS — R531 Weakness: Secondary | ICD-10-CM

## 2013-10-23 LAB — HEMOGLOBIN AND HEMATOCRIT, BLOOD
HCT: 30.6 % — ABNORMAL LOW (ref 36.0–46.0)
Hemoglobin: 10.3 g/dL — ABNORMAL LOW (ref 12.0–15.0)

## 2013-10-23 MED ORDER — MORPHINE SULFATE (CONCENTRATE) 10 MG /0.5 ML PO SOLN
5.0000 mg | ORAL | Status: DC | PRN
Start: 2013-10-23 — End: 2013-10-24
  Administered 2013-10-24: 5 mg via ORAL
  Filled 2013-10-23 (×2): qty 0.5

## 2013-10-23 MED ORDER — BISACODYL 10 MG RE SUPP
10.0000 mg | Freq: Every day | RECTAL | Status: DC | PRN
  Administered 2013-10-23: 10 mg via RECTAL
  Filled 2013-10-23: qty 1

## 2013-10-23 NOTE — Progress Notes (Signed)
NUTRITION FOLLOW UP  Intervention:   Magic cup TID with meals, each supplement provides 290 kcal and 9 grams of protein  Nutrition Dx:   Inadequate oral intake related to cognitive status/weakness as evidenced by SLP note; ongoing.   Goal:  Patient to meet >/=90% of estimated nutrition needs, not met.    Monitor:  PO diet advancement, weight, I/Os, labs  Assessment:   Patient with PMH of CVA, hypertension, and arthritis presented to the ER with discomfort in her left hip. Patient has a history of a fall on her left hip approximately 2 weeks and she suffered a stroke approximately 3 weeks ago. Both incidents have caused a limited mobility. Family reports that the patient has had much discomfort in her left him and has recently had a decreased oral intake of solids and fluids.   Palliative care consult pending, possibly plan is d/c home with Hospice.  Pt seen by SLP, diet advanced to dysphagia I with Nectar Thick Liquids. Pt unable to hold cup to drink herself and will need assistance. SLP recommends liquids via teaspoon. These diet restrictions will be unlikely to meet needs.   Height: Ht Readings from Last 1 Encounters:  09/18/13 _0  (1.575 m)    Weight Status:   Wt Readings from Last 1 Encounters:  09/18/13 115 lb (52.164 kg)    Re-estimated needs:  Kcal: 1200-1400  Protein: 60-70 grams  Fluid: 1.5 L  Skin: no issues noted  Diet Order: Dysphagia 1 with Nectar Thickened Liquids   Intake/Output Summary (Last 24 hours) at 10/23/13 1351 Last data filed at 10/23/13 0950  Gross per 24 hour  Intake   1200 ml  Output      0 ml  Net   1200 ml    Last BM: PTA   Labs:   Recent Labs Lab 10/21/13 1643 10/22/13 0440  NA 143 142  K 4.0 3.8  CL 102 106  CO2 23 22  BUN 14 13  CREATININE 0.60 0.54  CALCIUM 9.0 8.5  GLUCOSE 103* 140*    CBG (last 3)   Recent Labs  10/22/13 2019  GLUCAP 147*    Scheduled Meds: . amLODipine  5 mg Oral Daily  . aspirin EC   325 mg Oral Daily    Continuous Infusions:   Maylon Peppers RD, LDN, CNSC [phone] Pager [phone] After Hours Pager

## 2013-10-23 NOTE — Evaluation (Signed)
Clinical/Bedside Swallow Evaluation Patient Details  Name: Tamara Miller MRN: [id number] Date of Birth: 11/27/1909  Today's Date: 10/23/2013 Time: 0900-1000 SLP Time Calculation (min): 60 min  Past Medical History:  Past Medical History  Diagnosis Date  . Arthritis   . HTN (hypertension)     Taken off BP meds by PCP when BP normalized  . TIA (transient ischemic attack)   . Encephalopathy   . Stroke     right sided weakness, speech difficulty  . RBBB (right bundle branch block)   . Malnutrition    Past Surgical History:  Past Surgical History  Procedure Laterality Date  . Abdominal hysterectomy     HPI:  80 year old female admitted 10/21/13 due to fall and failure to thrive.  PMH significant for CVA, poor po intake.  CXR reveals nothing acute.  BSE ordered to identify least restrictive diet.   Assessment / Plan / Recommendation Clinical Impression  Oral care completed with suction.  Pt exhibits mouth breathing, so oral cavity was extremely dry and cracked.  Pt appeared to tolerate ice chips and puree consistencies without overt s/s aspiration.  She was unable to use the straw to drink water.  Pt unable to hold cup, to provide herself a cup sip, and given current status, it would be unsafe for staff to provide liquids in this manner.  Teaspoon boluses of thin were tolerated well, however, this would not be functional to meet hydration needs po.  It is for these reasons that nectar thick liquids are recommended, via teaspoon.  Pt may have ice chips.  Meds should be crushed due to poor awareness of bolus.    Aspiration Risk  Moderate    Diet Recommendation Dysphagia 1 (Puree);Nectar-thick liquid (ice chips ok)   Liquid Administration via: Spoon Medication Administration: Crushed with puree Supervision: Full supervision/cueing for compensatory strategies;Trained caregiver to feed patient Compensations: Small sips/bites;Slow rate;Check for pocketing Postural Changes and/or  Swallow Maneuvers: Seated upright 90 degrees;Upright 30-60 min after meal    Other  Recommendations Oral Care Recommendations: Oral care before and after PO;Staff/trained caregiver to provide oral care Other Recommendations: Have oral suction available;Remove water pitcher;Order thickener from pharmacy   Follow Up Recommendations  24 hour supervision/assistance;Skilled Nursing facility    Frequency and Duration min 1 x/week  1 week   Pertinent Vitals/Pain VSS, 5 on painad scale    SLP Swallow Goals  Follow posted precautions with max assist   Swallow Study Prior Functional Status   unknown. Pt unable to verbalize, and family unaware.    General Date of Onset: 10/21/13 HPI: 80 year old female admitted 10/21/13 due to fall and failure to thrive.  PMH significant for CVA, poor po intake.  CXR reveals nothing acute.  BSE ordered to identify least restrictive diet. Type of Study: Bedside swallow evaluation Previous Swallow Assessment: none found Diet Prior to this Study: NPO Temperature Spikes Noted: No Respiratory Status: Room air History of Recent Intubation: No Behavior/Cognition: Alert;Cooperative;Pleasant mood;Confused;Requires cueing Oral Cavity - Dentition: Poor condition;Missing dentition Self-Feeding Abilities: Total assist Patient Positioning: Partially reclined Baseline Vocal Quality: Clear Volitional Cough: Cognitively unable to elicit Volitional Swallow: Unable to elicit    Oral/Motor/Sensory Function Overall Oral Motor/Sensory Function: Appears within functional limits for tasks assessed   Ice Chips Ice chips: Within functional limits Presentation: Spoon   Thin Liquid Thin Liquid: Impaired Presentation: Spoon (unable to use straw) Oral Phase Impairments: Reduced labial seal;Poor awareness of bolus Oral Phase Functional Implications: Right anterior spillage Pharyngeal  Phase Impairments: Decreased hyoid-laryngeal movement    Nectar Thick Nectar Thick Liquid: Not  tested Other Comments: not tested, but anticipate this to be the most effective way to provide liquids via tsp   Honey Thick Honey Thick Liquid: Not tested   Puree Puree: Within functional limits Presentation: Spoon   Solid   GO    Solid: Not tested      Celia B. Murvin Natal [facility], CCC-SLP [phone]  Leigh Aurora 10/23/2013,11:41 AM

## 2013-10-23 NOTE — Progress Notes (Addendum)
[facility]                                                                              Patient Demographics  Tamara MinksMinnie Miller, is a [age] y.o. female, DOB - 04/27/1910, VWU:[id number]  Admit date - 10/21/2013   Admitting Physician Penny Piarlando Vega, MD  Outpatient Primary MD for the patient is Delorse LekBURNETT,BRENT A, MD  LOS - 2   Chief Complaint  Patient presents with  . Fall  . Failure To Thrive      Interim History This is a 80 year old female history of CVA, hypertension, arthritis up to approximately 2 weeks ago her left hip. Given his emergency department, CT showed no fracture of the left hip. PT has been consulted. Case management also consulted for possible hospice as well as palliative care also treatment.  Assessment & Plan   Failure to thrive / Protein calorie malnutrition, severe -Nutrition consulted  -Physical therapy consulted -Maintenance IV fluids  -CM for possible hospice  Arthritis  -Supportive therapy and pain control  History of CVA (cerebral infarction)  -Continue aspirin, currently stable   Left hip pain  -Most likely causing failure to thrive.  -Pain control, supportive therapy  -Physical therapy  -CT of the left hip show no acute fracture   Hypertension -Continue amlodipine   Anemia -Likely dilutional, will continue to monitor H/H   Code Status: Full  Family Communication: None at bedside  Disposition Plan: Admitted, Consulted case management for hospice and discharge planning  Time Spent in minutes   30 minutes  Procedures None  Consults   None  DVT Prophylaxis Heparin  Lab Results  Component Value Date   PLT 215 10/22/2013    Medications  Scheduled Meds: . amLODipine  5 mg Oral Daily  . aspirin EC  325 mg Oral Daily  . heparin  5,000 Units Subcutaneous Q8H  . simvastatin  20 mg Oral q1800   Continuous Infusions: . dextrose 5 % and 0.45 % NaCl with KCl 20 mEq/L 100 mL/hr at 10/22/13 1120   PRN Meds:.acetaminophen,  acetaminophen, morphine injection, ondansetron (ZOFRAN) IV, ondansetron, oxyCODONE  Antibiotics    Anti-infectives   None      Subjective:   Tamara Miller seen and examined today. Patient has no new complaints.  Still complains of hip pain.   Objective:   Filed Vitals:   10/22/13 0609 10/22/13 1508 10/22/13 2100 10/23/13 0625  BP: 178/94 162/69 159/69 154/70  Pulse: 94 82 89 82  Temp: 98.8 F (37.1 C)   98.7 F (37.1 C)  TempSrc: Oral     Resp: 18 18 18 18   SpO2: 98% 98% 100% 100%    Wt Readings from Last 3 Encounters:  09/18/13 52.164 kg (115 lb)  12/16/11 52.935 kg (116 lb 11.2 oz)  10/29/11 54.2 kg (119 lb 7.8 oz)     Intake/Output Summary (Last 24 hours) at 10/23/13 0817 Last data filed at 10/23/13 0542  Gross per 24 hour  Intake   1200 ml  Output      0 ml  Net   1200 ml    Exam  General: Well developed, well nourished, NAD, appears stated age  HEENT: NCAT, PERRLA,  EOMI, Anicteic Sclera  Neck: Supple, no JVD, no masses  Cardiovascular: S1 S2 auscultated, Regular rate and rhythm.  Respiratory: Clear to auscultation bilaterally with equal chest rise  Abdomen: Soft, nontender, nondistended, + bowel sounds  Extremities: warm dry without cyanosis clubbing or edema, Pain with movement of LLE.   Neuro:Awake and alert.  Oriented to self.  Able to follow direction.   Skin: Without rashes exudates or nodules  Data Review   Micro Results No results found for this or any previous visit (from the past 240 hour(s)).  Radiology Reports Dg Chest 2 View  10/21/2013   CLINICAL DATA:  Left hip/leg pain. History of hypertension and stroke.  EXAM: CHEST  2 VIEW  COMPARISON:  Radiographs 12/16/2011.  FINDINGS: 1750 hr. The heart size and mediastinal contours are normal. The lungs are clear. There is no pleural effusion or pneumothorax. No acute osseous findings are identified. Skin folds and telemetry leads overlie the chest. There is mild thoracic spondylosis.   IMPRESSION: No active cardiopulmonary process.   Electronically Signed   By: Roxy Horseman M.D.   On: 10/21/2013 18:01   Dg Hip Complete Left  10/21/2013   CLINICAL DATA:  Left hip and leg pain.  EXAM: LEFT HIP - COMPLETE 2+ VIEW  COMPARISON:  DG ABDOMEN 1V dated 12/05/2012; CT ABD/PELVIS W CM dated 11/29/2012  FINDINGS: The bones are diffusely demineralized. No acute fracture, dislocation or femoral head avascular necrosis is demonstrated. There are degenerative changes of the hips, sacroiliac joints and lower lumbar spine. Scattered pelvic calcifications are grossly stable.  IMPRESSION: No evidence of acute left hip injury. If the patient has persistent hip pain or inability to bear weight, follow up imaging may be warranted as hip fractures can be radiographically occult in the elderly.   Electronically Signed   By: Roxy Horseman M.D.   On: 10/21/2013 18:00   Ct Hip Left Wo Contrast  10/21/2013   CLINICAL DATA:  80 year old with left hip pain after falling from a wheelchair.  EXAM: CT OF THE LEFT HIP WITHOUT CONTRAST  TECHNIQUE: Multidetector CT imaging was performed according to the standard protocol. Multiplanar CT image reconstructions were also generated.  COMPARISON:  DG HIP COMPLETE*L* dated 10/21/2013; CT ABD W/CM dated 01/26/2009  FINDINGS: The bones are diffusely demineralized. There is diffuse chondrocalcinosis at the hips and symphysis pubis. There is spurring of the ischial tuberosity and greater trochanter. No acute fracture or dislocation is demonstrated.  There is no large hip joint effusion or periarticular hematoma. Sigmoid colon diverticular changes and scattered vascular calcifications are noted.  IMPRESSION: No evidence of acute left hip fracture or dislocation. Degenerative changes, diffuse chondrocalcinosis and osteopenia noted.   Electronically Signed   By: Roxy Horseman M.D.   On: 10/21/2013 20:21    CBC  Recent Labs Lab 10/21/13 1643 10/22/13 0440 10/23/13 0447  WBC 6.6 5.9  --    HGB 12.2 10.3* 10.3*  HCT 36.8 31.6* 30.6*  PLT 223 215  --   MCV 85.2 86.1  --   MCH 28.2 28.1  --   MCHC 33.2 32.6  --   RDW 14.3 14.3  --   LYMPHSABS 0.9  --   --   MONOABS 0.5  --   --   EOSABS 0.0  --   --   BASOSABS 0.0  --   --     Chemistries   Recent Labs Lab 10/21/13 1643 10/22/13 0440  NA 143 142  K 4.0 3.8  CL 102 106  CO2 23 22  GLUCOSE 103* 140*  BUN 14 13  CREATININE 0.60 0.54  CALCIUM 9.0 8.5   ------------------------------------------------------------------------------------------------------------------ CrCl is unknown because both a height and weight (above a minimum accepted value) are required for this calculation. ------------------------------------------------------------------------------------------------------------------ No results found for this basename: HGBA1C,  in the last 72 hours ------------------------------------------------------------------------------------------------------------------ No results found for this basename: CHOL, HDL, LDLCALC, TRIG, CHOLHDL, LDLDIRECT,  in the last 72 hours ------------------------------------------------------------------------------------------------------------------ No results found for this basename: TSH, T4TOTAL, FREET3, T3FREE, THYROIDAB,  in the last 72 hours ------------------------------------------------------------------------------------------------------------------ No results found for this basename: VITAMINB12, FOLATE, FERRITIN, TIBC, IRON, RETICCTPCT,  in the last 72 hours  Coagulation profile No results found for this basename: INR, PROTIME,  in the last 168 hours  No results found for this basename: DDIMER,  in the last 72 hours  Cardiac Enzymes  Recent Labs Lab 10/21/13 1643  TROPONINI <0.30   ------------------------------------------------------------------------------------------------------------------ No components found with this basename: POCBNP,     Kase Shughart,  Katie Moch D.O. on 10/23/2013 at 8:17 AM  Between 7am to 7pm - Pager - [phone]  After 7pm go to [email or web address] - password [email or web address]  And look for the night coverage person covering for me after hours  [facility] Office  [phone]

## 2013-10-23 NOTE — Care Management Note (Signed)
CARE MANAGEMENT NOTE 10/23/2013  Patient:  Glendora ScoreHREADGILL,Tyshell H   Account Number:  [id number]  Date Initiated:  10/23/2013  Documentation initiated by:  Vance PeperBRADY,Neftaly Inzunza  Subjective/Objective Assessment:   [age] yr old female,admitted s/p fall. FTT and dehydration.     Action/Plan:   Case Manager spoke with Patient's daughter-Dorothy Spruill [phone]. Offered choice for home hospice care. [facility] [place]. Delray AltMargie will speak with family this afternoon.   Anticipated DC Date:  10/24/2013   Anticipated DC Plan:  HOME W HOSPICE CARE      DC Planning Services  CM consult      PAC Choice  HOSPICE   Choice offered to / List presented to:  C-4 Adult Children           HH agency  [facility]   Status of service:  In process, will continue to follow

## 2013-10-23 NOTE — Progress Notes (Signed)
[facility]  Patient Status: Active (receiving services up to time of hospitalization)  [facility] is providing the following services: RN, PT and OT - [facility] before this re admission.  If patient discharges after hours, please call [phone].   Jodene NamMary Hickling 10/23/2013, 9:50 AM

## 2013-10-23 NOTE — Consult Note (Signed)
Patient ZO:XWRUEA H Tamara Miller      DOB: 13-Aug-1910      VWU:[id number]     Consult Note from the Palliative Medicine Team at [facility]    Consult Requested by:  Dr Tamara Miller     PCP: Tamara Lek, MD Reason for Consultation: Clarification of GOC and options    Phone Number:[phone]  Assessment of patients Current state:  This is a 80 year old female history of CVA, hypertension, arthritis with overall failure to thrive reported by family.  H/O poor po intake, falls, poor results during rehabilitation stay in the past month.  Family is faced with advanced directive decisions and anticipatory care needs   This NP Tamara Miller reviewed medical records, received report from team, assessed the patient and then meet at the patient's bedside along with her daughter Tamara Miller, grand-daughter Tamara Miller and a cousin Tamara Miller  to discuss diagnosis prognosis, GOC, EOL wishes disposition and options.   A detailed discussion was had today regarding advanced directives.  Concepts specific to code status, artifical feeding and hydration, continued IV antibiotics and rehospitalization was had.  The difference between a aggressive medical intervention path  and a palliative comfort care path for this patient at this time was had.  Values and goals of care important to patient and family were attempted to be elicited.  Concept of Hospice and Palliative Care were discussed  Natural trajectory and expectations at EOL were discussed.  Questions and concerns addressed.  Hard Choices booklet left for review. Family encouraged to call with questions or concerns.  PMT will continue to support holistically.  MOST form completed   Goals of Care: 1.  Code Status:DNR/DNI-comfort is main focus of care   2. Scope of Treatment:  1.  Focus of care is full comfort.  No further diagnostics, rehospitalizations  4. Disposition:  Home with hospice and hopeful for transition to residential when  eligible.  Will write for choice.   3. Symptom Management:    1. Pain/Dyspnea: Roxanol 5 mg every 3 hrs prn SL/PO 2. Bowel Regimen: Dulcolax supp prn 3. Dysphagia/Weakness:  Comfort feeds as toelrated  4. Psychosocial:  Emotional support offered to family.  They were able to share happy memories and stories about the patient.  They understand that she is failing and are hopeful for comfort at this time    Patient Documents Completed or Given: Document Given Completed  Advanced Directives Pkt    MOST  yes  DNR    Gone from My Sight    Hard Choices yes     Brief Tamara Miller is a 80 year old female history of CVA, hypertension, arthritis with overall failure to thrive reported by family.  H/O poor po intake, falls, poor results during rehabilitation stay in the past month.    ROS: unable to illicit due to decreased cognition    PMH:  Past Medical History  Diagnosis Date  . Arthritis   . HTN (hypertension)     Taken off BP meds by PCP when BP normalized  . TIA (transient ischemic attack)   . Encephalopathy   . Stroke     right sided weakness, speech difficulty  . RBBB (right bundle branch block)   . Malnutrition      PSH: Past Surgical History  Procedure Laterality Date  . Abdominal hysterectomy     I have reviewed the FH and SH and  If appropriate update it with new information. No Known Allergies Scheduled Meds: . amLODipine  5 mg Oral  Daily  . aspirin EC  325 mg Oral Daily   Continuous Infusions:  PRN Meds:.acetaminophen, acetaminophen, morphine injection, morphine CONCENTRATE, ondansetron (ZOFRAN) IV, ondansetron    BP 154/70  Pulse 82  Temp(Src) 98.7 F (37.1 C) (Oral)  Resp 18  SpO2 100%   PPS: 30 % at best   Intake/Output Summary (Last 24 hours) at 10/23/13 1232 Last data filed at 10/23/13 0542  Gross per 24 hour  Intake   1200 ml  Output      0 ml  Net   1200 ml    Physical Exam:  General:  Frail, thin, lethargic elderly  female HEENT:  Dry buccal membranes, + temporal muscle wasting Chest:   Decreased in bases  CAT CVS: RRR Abdomen: soft NT +BS Ext: without edema, pain on repsoitioning Neuro: opens eyes to gentle touch and verbal stimuli , lethargic  Labs: CBC    Component Value Date/Time   WBC 5.9 10/22/2013 0440   RBC 3.67* 10/22/2013 0440   HGB 10.3* 10/23/2013 0447   HCT 30.6* 10/23/2013 0447   PLT 215 10/22/2013 0440   MCV 86.1 10/22/2013 0440   MCH 28.1 10/22/2013 0440   MCHC 32.6 10/22/2013 0440   RDW 14.3 10/22/2013 0440   LYMPHSABS 0.9 10/21/2013 1643   MONOABS 0.5 10/21/2013 1643   EOSABS 0.0 10/21/2013 1643   BASOSABS 0.0 10/21/2013 1643    BMET    Component Value Date/Time   NA 142 10/22/2013 0440   K 3.8 10/22/2013 0440   CL 106 10/22/2013 0440   CO2 22 10/22/2013 0440   GLUCOSE 140* 10/22/2013 0440   BUN 13 10/22/2013 0440   CREATININE 0.54 10/22/2013 0440   CALCIUM 8.5 10/22/2013 0440   GFRNONAA 74* 10/22/2013 0440   GFRAA 86* 10/22/2013 0440    CMP     Component Value Date/Time   NA 142 10/22/2013 0440   K 3.8 10/22/2013 0440   CL 106 10/22/2013 0440   CO2 22 10/22/2013 0440   GLUCOSE 140* 10/22/2013 0440   BUN 13 10/22/2013 0440   CREATININE 0.54 10/22/2013 0440   CALCIUM 8.5 10/22/2013 0440   PROT 6.9 09/18/2013 0755   ALBUMIN 3.4* 09/18/2013 0755   AST 16 09/18/2013 0755   ALT 6 09/18/2013 0755   ALKPHOS 94 09/18/2013 0755   BILITOT 0.3 09/18/2013 0755   GFRNONAA 74* 10/22/2013 0440   GFRAA 86* 10/22/2013 0440     Time In Time Out Total Time Spent with Patient Total Overall Time  1100 1230 80 min 90 min    Greater than 50%  of this time was spent counseling and coordinating care related to the above assessment and plan.  Tamara CreedMary September Mormile NP  Palliative Medicine Team Team Phone # [phone] Pager [phone]  Discussed with Dr Tamara GosselinMikhail

## 2013-10-23 NOTE — Progress Notes (Signed)
Physical Therapy Note   Noted status has now changed to Inpatient  Worth considering SNF for dc planning  Continue to feel pt is appropriate for a Palliative Care Team consult for discernment of Goals of Care.  Thanks,  Ozark AcresHolly Cleo Villamizar, South CarolinaPT [phone]

## 2013-10-23 NOTE — Progress Notes (Addendum)
Notified by Pieter PartridgeSusan CMRN, patient and family request services of [facility] after discharge. Spoke with daughter Germain OsgoodDorothy Spruill and cousin Alvino ChapelJo, at bedside to initiate education related to hospice services, philosophy and team approach to care- they voiced good understanding of information provided.  Per discussion plan is to d/c by non-emergent transport tomorrow, Wednesday 10/24/13 *Please send completed GOLD DNR form home with pt On this visit pt repositioned by CNA and moaning with repositioning Staff RN [facility] Center[facility] aware and will address -pt currently has concentrated oral liquid Morphine 5 mg q 3hrs prn ordered per PMT provider    DME needs discussed -per daughter pt [facility] she is requesting an [employer] Mattress pad to go over existing mattress; daughter declines any other equipment needs at this time Darl Pikes- Susan [facility] to contact [facility] representative and request [facility] contact daughter Nicole CellaDorothy @ [phone] or at Room phone to arrange delivery  -an alternate number for contact related to DME is daughter's husband Tad MooreJay Spruill c: [phone]  [facility]  Please notify [facility] when patient is ready to leave unit at d/c call [phone] (or [phone] if after 5 pm);  [facility] information and contact numbers also given to daughter Germain OsgoodDorothy Spruill during visit.   Above information shared with Vance PeperSusan [facility] LtdCMRN Please call with any questions or concerns   Valente DavidMargie Annabelle Rexroad, RN 10/23/2013, 3:33 PM [facility] RN Liaison [phone]

## 2013-10-24 MED ORDER — MORPHINE SULFATE (CONCENTRATE) 10 MG /0.5 ML PO SOLN: 5.0000 mg | ORAL | Status: AC | PRN

## 2013-10-24 NOTE — Progress Notes (Signed)
SLP Cancellation Note  Patient Details Name: Glendora ScoreMinnie H Meeker MRN: [id number] DOB: 11/20/1909   Cancelled treatment:        Notes read.  Pt now comfort care.  ST to sign off.  Please reconsult if needs arise. Arnelle Nale B. Murvin NatalBueche, [facility] CenterMSP, CCC-SLP [phone]  Leigh AuroraBueche, Kharter Sestak Brown 10/24/2013, 1:43 PM

## 2013-10-24 NOTE — Discharge Summary (Signed)
Physician Discharge Summary     Tamara Miller OZH:[id number] DOB: [birth year] DOA: 10/21/2013  PCP: Tamara Lek, MD  Admit date: 10/21/2013 Discharge date: 10/24/2013  Time spent: 20 minutes  Recommendations for Outpatient Follow-up:  1. Patient has been transitioned to full comfort measures 2. I have discontinued most medications other than aspirin 325 mg for secondary prevention of stroke-she does not need tight blood pressure control nor does she need HMG co-reductase inhibitors 3. She'll be discharged on concentrated morphine solution 3 times a day as needed for moderate pain or shortness of breath   Discharge Diagnoses:  Active Problems:   Failure to thrive   Arthritis   Hypertension   CVA (cerebral infarction)   Left hip pain   Failure to thrive in adult   Protein-calorie malnutrition, severe   Palliative care encounter   Weakness generalized   Pain, generalized   Discharge Condition: Guarded  Diet recommendation: Pured diet however try to simplify/liberalize as much as possible  There were no vitals filed for this visit.  History of present illness:  80 year old African American female admitted 10/21/13 with left hip discomfort limited mobility and notable prior history for stroke early January 2015. Patient had a CT scan in the emergency room on admission which showed no further evidence of left hip fracture/dislocation. At baseline apparently is wheelchair bound but has had increasing pain and poor by mouth intake  Hospital Course:  Patient was admitted, nutrition and physical therapy were consulted and it was noted that patient had progressive decline even while here in the hospital.  Palliative care team was consulted to help delineate goals of care and patient was ultimately made full comfort care and the decision was made for patient to transition to comfort trajectory at home. On day of discharge patient is pleasant oriented to And able to verbalize to  some extent where she was but not clearly other than name. No family is present at bedside. She was recommended dysphagia 1, nectar thick liquid and to be seated upright at 90 30-60 minutes after meal. Given she is now complete comfort, it is expected that she potentially will aspirate.  Consultations:  Palliative care team  Discharge Exam: Filed Vitals:   10/24/13 0423  BP: 134/58  Pulse: 80  Temp: 98.4 F (36.9 C)  Resp: 16    General: Alert x1 Cardiovascular: S1-S2 no murmur rub or gallop Respiratory: Clinically clear  Discharge Instructions  Discharge Orders   Future Appointments Provider Department Dept Phone   11/27/2013 2:45 PM Micki Riley, MD [facility] [phone]   Future Orders Complete By Expires   Diet - low sodium heart healthy  As directed    Increase activity slowly  As directed        Medication List    STOP taking these medications       amLODipine 10 MG tablet  Commonly known as:  NORVASC     simvastatin 20 MG tablet  Commonly known as:  ZOCOR      TAKE these medications       aspirin 325 MG EC tablet  Take 1 tablet (325 mg total) by mouth daily.     morphine CONCENTRATE 10 mg / 0.5 ml concentrated solution  Take 0.25 mLs (5 mg total) by mouth every 3 (three) hours as needed for moderate pain or shortness of breath.       No Known Allergies     Follow-up Information   Follow up with [facility] ([facility] to follow aftr d/c, pls notify whn pt ready to leave unit at d/c call [phone] (or [phone] aftr 5 pm))    Contact information:   [facility] [address] [facility] [phone]       The results of significant diagnostics from this hospitalization (including imaging, microbiology, ancillary and laboratory) are listed below for reference.    Significant Diagnostic Studies: Dg Chest 2 View  10/21/2013   CLINICAL DATA:  Left hip/leg pain. History of hypertension and  stroke.  EXAM: CHEST  2 VIEW  COMPARISON:  Radiographs 12/16/2011.  FINDINGS: 1750 hr. The heart size and mediastinal contours are normal. The lungs are clear. There is no pleural effusion or pneumothorax. No acute osseous findings are identified. Skin folds and telemetry leads overlie the chest. There is mild thoracic spondylosis.  IMPRESSION: No active cardiopulmonary process.   Electronically Signed   By: Roxy Horseman M.D.   On: 10/21/2013 18:01   Dg Hip Complete Left  10/21/2013   CLINICAL DATA:  Left hip and leg pain.  EXAM: LEFT HIP - COMPLETE 2+ VIEW  COMPARISON:  DG ABDOMEN 1V dated 12/05/2012; CT ABD/PELVIS W CM dated 11/29/2012  FINDINGS: The bones are diffusely demineralized. No acute fracture, dislocation or femoral head avascular necrosis is demonstrated. There are degenerative changes of the hips, sacroiliac joints and lower lumbar spine. Scattered pelvic calcifications are grossly stable.  IMPRESSION: No evidence of acute left hip injury. If the patient has persistent hip pain or inability to bear weight, follow up imaging may be warranted as hip fractures can be radiographically occult in the elderly.   Electronically Signed   By: Roxy Horseman M.D.   On: 10/21/2013 18:00   Ct Hip Left Wo Contrast  10/21/2013   CLINICAL DATA:  80 year old with left hip pain after falling from a wheelchair.  EXAM: CT OF THE LEFT HIP WITHOUT CONTRAST  TECHNIQUE: Multidetector CT imaging was performed according to the standard protocol. Multiplanar CT image reconstructions were also generated.  COMPARISON:  DG HIP COMPLETE*L* dated 10/21/2013; CT ABD W/CM dated 01/26/2009  FINDINGS: The bones are diffusely demineralized. There is diffuse chondrocalcinosis at the hips and symphysis pubis. There is spurring of the ischial tuberosity and greater trochanter. No acute fracture or dislocation is demonstrated.  There is no large hip joint effusion or periarticular hematoma. Sigmoid colon diverticular changes and scattered  vascular calcifications are noted.  IMPRESSION: No evidence of acute left hip fracture or dislocation. Degenerative changes, diffuse chondrocalcinosis and osteopenia noted.   Electronically Signed   By: Roxy Horseman M.D.   On: 10/21/2013 20:21    Microbiology: No results found for this or any previous visit (from the past 240 hour(s)).   Labs: Basic Metabolic Panel:  Recent Labs Lab 10/21/13 1643 10/22/13 0440  NA 143 142  K 4.0 3.8  CL 102 106  CO2 23 22  GLUCOSE 103* 140*  BUN 14 13  CREATININE 0.60 0.54  CALCIUM 9.0 8.5   Liver Function Tests: No results found for this basename: AST, ALT, ALKPHOS, BILITOT, PROT, ALBUMIN,  in the last 168 hours No results found for this basename: LIPASE, AMYLASE,  in the last 168 hours No results found for this basename: AMMONIA,  in the last 168 hours CBC:  Recent Labs Lab 10/21/13 1643 10/22/13 0440 10/23/13 0447  WBC 6.6 5.9  --   NEUTROABS 5.2  --   --   HGB 12.2 10.3* 10.3*  HCT 36.8 31.6*  30.6*  MCV 85.2 86.1  --   PLT 223 215  --    Cardiac Enzymes:  Recent Labs Lab 10/21/13 1643  TROPONINI <0.30   BNP: BNP (last 3 results) No results found for this basename: PROBNP,  in the last 8760 hours CBG:  Recent Labs Lab 10/22/13 2019  GLUCAP 147*       Signed:  Rhetta MuraSAMTANI, JAI-GURMUKH  [facility] 10/24/2013, 11:15 AM

## 2013-10-24 NOTE — Progress Notes (Signed)
Physical Therapy Treatment Patient Details Name: Tamara ScoreMinnie H Rothlisberger MRN: [id number] DOB: 03/16/1910 Today's Date: 10/24/2013 Time: [id number] PT Time Calculation (min): 10 min  PT Assessment / Plan / Recommendation  History of Present Illness  Tamara Miller is a [age] y.o. female  With history of CVA, hypertension, arthritis. With recent history of a fall approximately 2 weeks ago on her left hip. Since the fall has had discomfort in her left hip which reportedly has not gotten better and has gotten worse. Patient has had limited mobility as a result. Patient had a stroke her approximately 3 weeks ago and has had difficulty ambulating as a result. Family has been using a wheelchair for transportation. Reportedly fell from her wheelchair recently. Family reports the patient has had much discomfort in her left hip and has recently had decreased oral intake of solids and fluids. Given persistence of symptoms family decided to bring patient to the ED for further evaluation.   PT Comments   Pt's granddaughter present throughout session.  Session focused only on repositioning pt in bed to eat breakfast.  Pt required +2 total assist & with no active participation.     Follow Up Recommendations   **Plans are for pt to return home with family & with hospice & Palliative care services**      Does the patient have the potential to tolerate intense rehabilitation     Barriers to Discharge        Equipment Recommendations  Wheelchair cushion (measurements PT) (hoyer lift, ramp)    Recommendations for Other Services OT consult (for ADL's/family education)  Frequency     Progress towards PT Goals    Plan      Precautions / Restrictions Precautions Precautions: Fall Restrictions Weight Bearing Restrictions: No   Pertinent Vitals/Pain Moans & yells out with repositioning.  Pt does not rate pain nor specify pain.      Mobility  Bed Mobility Overal bed mobility: +2 for physical  assistance Bed Mobility:  (scooting to HOB) Supine to sit: +2 for physical assistance;Total assist General bed mobility comments: Pt with no active participation [facility] to eat breakfast.  Yells with any movement of LE's due to pain.        PT Goals (current goals can now be found in the care plan section) Acute Rehab PT Goals Patient Stated Goal: Family hopes to be able to care for Tamara Miller well at home PT Goal Formulation: With family Time For Goal Achievement: 11/05/13 Potential to Achieve Goals: Fair  Visit Information  Last PT Received On: 10/24/13 Assistance Needed: +2 History of Present Illness:  Tamara Miller is a [age] y.o. female  With history of CVA, hypertension, arthritis. With recent history of a fall approximately 2 weeks ago on her left hip. Since the fall has had discomfort in her left hip which reportedly has not gotten better and has gotten worse. Patient has had limited mobility as a result. Patient had a stroke her approximately 3 weeks ago and has had difficulty ambulating as a result. Family has been using a wheelchair for transportation. Reportedly fell from her wheelchair recently. Family reports the patient has had much discomfort in her left hip and has recently had decreased oral intake of solids and fluids. Given persistence of symptoms family decided to bring patient to the ED for further evaluation.    Subjective Data  Patient Stated Goal: Family hopes to be able to care for Tamara Miller well at  home   Cognition  Cognition Behavior During Therapy: Anxious Overall Cognitive Status: Impaired/Different from baseline Area of Impairment: Attention;Following commands Current Attention Level: Sustained Following Commands: Follows one step commands inconsistently    Balance     End of Session PT - End of Session Patient left: in bed;with call bell/phone within reach;with family/visitor present   GP     Lara Mulch 10/24/2013, 10:50 AM   Verdell Face, PTA [phone] 10/24/2013

## 2013-10-24 NOTE — Consult Note (Signed)
I have reviewed and discussed the care of this patient in detail with the nurse practitioner including pertinent patient records, physical exam findings and data. I agree with details of this encounter.  

## 2013-10-24 NOTE — Progress Notes (Signed)
Physical Therapy Discharge Patient Details Name: Tamara Miller MRN: [id number] DOB: 03/21/1910 Today's Date: 10/24/2013 Time: [id number] PT Time Calculation (min): 10 min  Patient discharged from PT services secondary to pt now comfort care.  Please see latest therapy progress note for current level of functioning and progress toward goals.    Progress and discharge plan discussed with patient and/or caregiver       Verdell FaceKelly Shahida Schnackenberg, [place] [phone] 10/24/2013

## 2013-10-24 NOTE — Progress Notes (Signed)
Discussed case with Tresa EndoKelly, PTA, and agree with dc of PT services; ThomastonHolly Marlen Koman, [place] CarolinaPT [phone]

## 2013-11-27 ENCOUNTER — Ambulatory Visit: Payer: Self-pay | Admitting: Neurology

## 2013-12-02 DEATH — deceased
